# Patient Record
Sex: Male | Born: 1950
Health system: Southern US, Community
[De-identification: ages and names within clinical notes are randomized; demographics above are authoritative.]

## PROBLEM LIST (undated history)

## (undated) DIAGNOSIS — J449 Chronic obstructive pulmonary disease, unspecified: Secondary | ICD-10-CM

## (undated) HISTORY — PX: NECK SURGERY: SHX720

## (undated) HISTORY — DX: Chronic obstructive pulmonary disease, unspecified: J44.9

---

## 2004-11-08 ENCOUNTER — Ambulatory Visit: Payer: Self-pay | Admitting: Unknown Physician Specialty

## 2005-10-02 ENCOUNTER — Ambulatory Visit: Payer: Self-pay | Admitting: Internal Medicine

## 2005-10-22 ENCOUNTER — Encounter: Admission: RE | Admit: 2005-10-22 | Discharge: 2005-10-22 | Payer: Self-pay | Admitting: Neurosurgery

## 2005-10-30 ENCOUNTER — Inpatient Hospital Stay (HOSPITAL_COMMUNITY): Admission: RE | Admit: 2005-10-30 | Discharge: 2005-10-31 | Payer: Self-pay | Admitting: Neurosurgery

## 2008-05-19 ENCOUNTER — Emergency Department: Payer: Self-pay | Admitting: Emergency Medicine

## 2008-10-31 ENCOUNTER — Ambulatory Visit: Payer: Self-pay | Admitting: Family Medicine

## 2008-10-31 DIAGNOSIS — M549 Dorsalgia, unspecified: Secondary | ICD-10-CM

## 2008-10-31 DIAGNOSIS — I10 Essential (primary) hypertension: Secondary | ICD-10-CM

## 2008-10-31 HISTORY — DX: Dorsalgia, unspecified: M54.9

## 2008-10-31 HISTORY — DX: Essential (primary) hypertension: I10

## 2008-11-15 ENCOUNTER — Encounter: Admission: RE | Admit: 2008-11-15 | Discharge: 2008-11-15 | Payer: Self-pay | Admitting: Family Medicine

## 2009-03-15 ENCOUNTER — Ambulatory Visit: Payer: Self-pay | Admitting: Family Medicine

## 2009-03-15 DIAGNOSIS — R5381 Other malaise: Secondary | ICD-10-CM

## 2009-03-15 DIAGNOSIS — R5383 Other fatigue: Secondary | ICD-10-CM

## 2009-03-18 LAB — CONVERTED CEMR LAB
Albumin: 4 g/dL (ref 3.5–5.2)
BUN: 8 mg/dL (ref 6–23)
Calcium: 9.5 mg/dL (ref 8.4–10.5)
Cholesterol: 171 mg/dL (ref 0–200)
Eosinophils Relative: 3.2 % (ref 0.0–5.0)
GFR calc non Af Amer: 81.39 mL/min (ref >60)
Glucose, Bld: 100 mg/dL — ABNORMAL HIGH (ref 70–99)
HCT: 50.3 % (ref 39.0–52.0)
HDL: 54.4 mg/dL (ref >39.00)
Hemoglobin: 16.5 g/dL (ref 13.0–17.0)
Lymphs Abs: 2.6 10*3/uL (ref 0.7–4.0)
Monocytes Relative: 6.2 % (ref 3.0–12.0)
Neutro Abs: 5.1 10*3/uL (ref 1.4–7.7)
PSA: 5.76 ng/mL — ABNORMAL HIGH (ref 0.10–4.00)
Sodium: 141 meq/L (ref 135–145)
VLDL: 18.8 mg/dL (ref 0.0–40.0)
WBC: 8.6 10*3/uL (ref 4.5–10.5)

## 2009-03-19 ENCOUNTER — Ambulatory Visit: Payer: Self-pay | Admitting: Family Medicine

## 2009-03-19 DIAGNOSIS — F17200 Nicotine dependence, unspecified, uncomplicated: Secondary | ICD-10-CM | POA: Insufficient documentation

## 2009-03-19 DIAGNOSIS — R972 Elevated prostate specific antigen [PSA]: Secondary | ICD-10-CM

## 2009-03-19 HISTORY — DX: Elevated prostate specific antigen (PSA): R97.20

## 2009-04-04 ENCOUNTER — Encounter: Payer: Self-pay | Admitting: Family Medicine

## 2009-04-18 ENCOUNTER — Ambulatory Visit: Payer: Self-pay | Admitting: Family Medicine

## 2009-04-24 ENCOUNTER — Encounter: Payer: Self-pay | Admitting: Family Medicine

## 2009-04-25 LAB — CONVERTED CEMR LAB: PSA, Free Pct: 15 — ABNORMAL LOW (ref >25)

## 2010-04-09 NOTE — Letter (Signed)
Summary: [facility]  [facility]   Imported By: Lanelle Bal 04/30/2009 13:52:52  _____________________________________________________________________  External Attachment:    Type:   Image     Comment:   External Document

## 2010-04-09 NOTE — Letter (Signed)
Summary: [facility]  [facility]   Imported By: Sherian Rein 04/16/2009 14:17:02  _____________________________________________________________________  External Attachment:    Type:   Image     Comment:   External Document

## 2010-04-09 NOTE — Assessment & Plan Note (Signed)
Summary: CPX / LFW   Vital Signs:  Patient profile:   60 year old male Height:      76 inches Weight:      191.6 pounds BMI:     23.41 Temp:     98.5 degrees F oral Pulse rate:   92 / minute Pulse rhythm:   regular BP sitting:   160 / 90  (left arm) Cuff size:   regular  Vitals Entered By: Benny Lennert CMA Duncan Dull) (March 19, 2009 2:27 PM)  History of Present Illness: Chief complaint cpx  Colonoscopy - 2007 Tdap - 2004 Flu - declines  Elevated PSA: PSA is 5.7. Reviewed history. No known FH Prostate CA, no tenderness in and around the perineal area, no penile dyscharge, no dysuria, no pain with ejaculation. No increased urination. No pain with BM. No known prior elevated PSA or abnormal rectal exam.  [facility]:     Contraindications/Deferment of Procedures/Staging:    Test/Procedure: FLU VAX    Reason for deferment: patient declined   Preventive Screening-Counseling & Management  Alcohol-Tobacco     Alcohol drinks/day: 0     Alcohol Counseling: not indicated; patient does not drink     Smoking Status: current     Smoke Cessation Stage: ready     Tobacco Counseling: to quit use of tobacco products  Caffeine-Diet-Exercise     Diet Counseling: to improve diet; diet is suboptimal     Does Patient Exercise: no     Exercise Counseling: to improve exercise regimen  Hep-HIV-STD-Contraception     STD Risk: no risk noted     Testicular SE Education/Counseling to perform regular STE      Sexual History:  currently monogamous.        Drug Use:  never.    Allergies (verified): No Known Drug Allergies  Past History:  Past medical, surgical, family and social histories (including risk factors) reviewed, and no changes noted (except as noted below).  Past Medical History: Reviewed history from 10/31/2008 and no changes required. Arthritis chicken pox Hypertension  Past Surgical History: Reviewed history from 10/31/2008 and no changes required. tonsils  and adenoids 1955 7/07 Had neck surgery to replace disc in neck  Family History: Reviewed history from 10/31/2008 and no changes required. Family History of Arthritis Family History Breast cancer 1st degree relative <50 Family History Diabetes 1st degree relative Family History Hypertension Family History of Prostate CA 1st degree relative <50 Family History of Sudden Death  Social History: Reviewed history from 10/31/2008 and no changes required. Occupation:Parts driver Divorced Alcohol use-no Drug use-no Regular exercise-no Quit smokingSmoking Status:  current STD Risk:  no risk noted Sexual History:  currently monogamous Drug Use:  never  Review of Systems  General: Denies fever, chills, sweats, anorexia, fatigue, weakness, malaise Eyes: Denies blurring, vision loss ENT: Denies earache, nasal congestion, nosebleeds, sore throat, and hoarseness.  Cardiovascular: Denies chest pains, palpitations, syncope, dyspnea on exertion,  Respiratory: Denies cough, dyspnea at rest, excessive sputum,wheeezing GI: Denies nausea, vomiting, diarrhea, constipation, change in bowel habits, abdominal pain, melena, hematochezia GU: Denies dysuria, hematuria, discharge, urinary frequency, urinary hesitancy, nocturia, incontinence, genital sores, decreased libido Musculoskeletal: Denies back pain, joint pain Derm: Denies rash, itching Neuro: Denies  paresthesias, frequent falls, frequent headaches, and difficulty walking.  Psych: Denies depression, anxiety Endocrine: Denies cold intolerance, heat intolerance, polydipsia, polyphagia, polyuria, and unusual weight change.  Heme: Denies enlarged lymph nodes Allergy: No hayfever   Otherwise, the pertinent positives and negatives are listed  above and in the HPI, otherwise a full review of systems has been reviewed and is negative unless noted positive.   Physical Exam  General:  Well-developed,well-nourished,in no acute distress; alert,appropriate  and cooperative throughout examination Head:  Normocephalic and atraumatic without obvious abnormalities. No apparent alopecia or balding. Eyes:  vision grossly intact, pupils equal, pupils round, pupils reactive to light, and pupils react to accomodation.   Ears:  External ear exam shows no significant lesions or deformities.  Otoscopic examination reveals clear canals, tympanic membranes are intact bilaterally without bulging, retraction, inflammation or discharge. Hearing is grossly normal bilaterally. Nose:  External nasal examination shows no deformity or inflammation. Nasal mucosa are pink and moist without lesions or exudates. Mouth:  Oral mucosa and oropharynx without lesions or exudates.  Teeth in good repair. Neck:  No deformities, masses, or tenderness noted. Chest Wall:  No deformities, masses, tenderness or gynecomastia noted. Lungs:  Normal respiratory effort, chest expands symmetrically. Lungs are clear to auscultation, no crackles or wheezes. Heart:  Normal rate and regular rhythm. S1 and S2 normal without gallop, murmur, click, rub or other extra sounds. Abdomen:  Bowel sounds positive,abdomen soft and non-tender without masses, organomegaly or hernias noted. Rectal:  No external abnormalities noted. Normal sphincter tone. No rectal masses or tenderness. Genitalia:  Testes bilaterally descended without nodularity, tenderness or masses. No scrotal masses or lesions. No penis lesions or urethral discharge. Prostate:  Enlarged prostate diffusely, no tenderness, not boggy, no nodules Msk:  normal ROM and no crepitation.   Extremities:  No clubbing, cyanosis, edema, or deformity noted with normal full range of motion of all joints.   Neurologic:  alert & oriented X3, sensation intact to light touch, and gait normal.   Skin:  no rashes.   Cervical Nodes:  No lymphadenopathy noted Inguinal Nodes:  No significant adenopathy Psych:  Cognition and judgment appear intact. Alert and  cooperative with normal attention span and concentration. No apparent delusions, illusions, hallucinations   Impression & Recommendations:  Problem # 1:  PREVENTIVE HEALTH CARE (ICD-V70.0) The patient's preventative maintenance and recommended screening tests for an annual wellness exam were reviewed in full today. Brought up to date unless services declined.  Counselled on the importance of diet, exercise, and its role in overall health and mortality. The patient's FH and SH was reviewed, including their home life, tobacco status, and drug and alcohol status.   Lose weight  Problem # 2:  PSA, INCREASED (ICD-790.93) Assessment: New Exam and history not c/w prostatitis. Patient needs further evaluation by Urology.  We reviewed PSA and prostate health for > 10 mins over and above routine maintaince, 100% in counselling. Reviewed false positives, biopsies, and prostate cancer.  Orders: Urology Referral (Urology)  Problem # 3:  SMOKER (ICD-305.1) The patient does smoke cigarettes, and we discussed that tobacco is harmful to one's overall health, and that likely quitting smoking would be the single best thing that they could do for their health.   started smoking again - ready to quit  His updated medication list for this problem includes:    Chantix Starting Month Pak 0.5 Mg X 11 & 1 Mg X 42 Misc (Varenicline tartrate) .Marland Kitchen... 0.5mg  by mouth once daily for 3 days, then twice daily for 4 days and then 1mg  by mouth 2 times daily    Chantix 1 Mg Tabs (Varenicline tartrate) .Marland Kitchen... 1 tab by mouth 2 times daily  Complete Medication List: 1)  Hydrochlorothiazide 25 Mg Tabs (Hydrochlorothiazide) .... Take 1  tab by mouth every morning 2)  Lisinopril-hydrochlorothiazide 20-25 Mg Tabs (Lisinopril-hydrochlorothiazide) .Marland Kitchen.. 1 by mouth daily 3)  Chantix Starting Month Pak 0.5 Mg X 11 & 1 Mg X 42 Misc (Varenicline tartrate) .... 0.5mg  by mouth once daily for 3 days, then twice daily for 4 days and then  1mg  by mouth 2 times daily 4)  Chantix 1 Mg Tabs (Varenicline tartrate) .Marland Kitchen.. 1 tab by mouth 2 times daily  Other Orders: Tobacco use cessation intermediate 3-10 minutes (04540)  Patient Instructions: 1)  Referral Appointment Information 2)  Day/Date: 3)  Time: 4)  Place/MD: 5)  Address: 6)  Phone/Fax: 7)  Patient given appointment information. Information/Orders faxed/mailed.  Prescriptions: CHANTIX 1 MG TABS (VARENICLINE TARTRATE) 1 tab by mouth 2 times daily  #60 x 2   Entered and Authorized by:   Hannah Beat MD   Signed by:   Hannah Beat MD on 03/19/2009   Method used:   Print then Give to Patient   RxID:   [id number] CHANTIX STARTING MONTH PAK 0.5 MG X 11 & 1 MG X 42  MISC (VARENICLINE TARTRATE) 0.5mg  by mouth once daily for 3 days, then twice daily for 4 days and then 1mg  by mouth 2 times daily  #1 pack x 0   Entered and Authorized by:   Hannah Beat MD   Signed by:   Hannah Beat MD on 03/19/2009   Method used:   Electronically to        [facility] [address] (retail)       [address]       [place], [place]  [address]       Ph: [phone]       Fax: [phone]   RxID:   [id number] LISINOPRIL-HYDROCHLOROTHIAZIDE 20-25 MG TABS (LISINOPRIL-HYDROCHLOROTHIAZIDE) 1 by mouth daily  #30 x 5   Entered and Authorized by:   Hannah Beat MD   Signed by:   Hannah Beat MD on 03/19/2009   Method used:   Electronically to        [facility] [address] (retail)       [address]       [place], [place]  [address]       Ph: [phone]       Fax: [phone]   RxID:   [id number]   Current Allergies (reviewed today): No known allergies   TD Result Date:  03/10/2002 TD Result:  given Colonoscopy Result Date:  03/10/2005 Colonoscopy Result:  abnormal Colonoscopy Next Due:  5 yr

## 2010-07-26 NOTE — Op Note (Signed)
NAME:  EDER, MACEK NO.:  [id number]   MEDICAL RECORD NO.:  [id number]          PATIENT TYPE:  INP   LOCATION:  3012                         FACILITY:  [facility]   PHYSICIAN:  Clydene Fake, M.D.  DATE OF BIRTH:  12/28/1950   DATE OF PROCEDURE:  10/30/2005  DATE OF DISCHARGE:                                 OPERATIVE REPORT   DIAGNOSES:  Atrophy, spondylosis, femoral stenosis.  Severe weakness right  arm C3-4, C4-5, and C5-6.   POSTOPERATIVE DIAGNOSES:  Atrophy spondylosis, femoral stenosis.  Severe  weakness right arm C3-4, C4-5, and C5-6.   PROCEDURE:  Intracervical decompression, diskectomy fusion at C3-4, C4-5,  and C5-6 with left allograft bone and Eagle anterior cervical plate from C3-  6.   SURGEON:  Phoebe Perch.   ASSISTANT:  Venetia Maxon.   ANESTHESIA:  General endotracheal tube.   ESTIMATED BLOOD LOSS:  Minimal.   BLOOD GIVEN:  None.   DRAINS:  None.   COMPLICATIONS:  None.   REASON FOR PROCEDURE:  The patient is a 60 year old gentleman who started  having some right-sided neck and shoulder pain in the beginning of July and  over a couple-week period of time with progressive weakness to his right  shoulder girdle.  He was weak with an inability to abduct his arm more than  about 10 degrees.  MRI and then a myelogram, and EMG nerve conduction tests  were all done showing spondylitic changes of his cervical spine with left  biforaminal narrowing C3-4, __________ left foraminal narrowing at C4-5, and  biforaminal narrowing with spondylitic changes at C5-6 with a suggestion of  a disk herniation off to the right side.  Imaging showed an acute severe  right C5 radiculopathy.  After much discussion, it was decided to decompress  the nerve roots coming off the right side to see if we can help the  patient's severe weakness.   PROCEDURE IN DETAIL:  The patient was brought to the operating room.  General anesthesia was inducted.  The patient was placed in  10-pound  cervical traction, prepped and draped in a sterile fashion.  The proposed  incision was injected with 10 mL of 1% lidocaine with epinephrine.  Incision  was then made from the midline to the anterior border of the  sternocleidomastoid muscle on the left side.  Neck incision taken down to  the platysma and hemostasis was obtained with the Bovie.  The platysma was  opened with the Bovie and then blunt dissection was taken through the  anterior cervical fascia to the anterior cervical spine.  A needle was  placed in the interspace.  An x-ray was obtained showing the C4-5  interspace.  The interspace was incised with a 15 blade.  Needle was  removed.  Partial diskectomy performed.  Longus colli muscles were reflected  laterally from C3 through C6 bilaterally.  Self-retaining retractors were  placed between the C3-4 and C4-5 disk.  Distraction pins were placed in the  C3 and C5.  The interspaces were incised with a 15 blade.  Partial  diskectomy performed and the  interspace distracted.  Microscope was brought  in for microdissection and starting at C4-5 the diskectomy was continued  with pituitary rongeurs and curettes.  When we got to the posterior disk  space, 1 and 2 mm Kerrison punches were used to remove the posterior disk,  posterior ligament and posterior osteophytes and then performed a bilateral  foraminotomy.  When we were finished we obtained good central decompression  and bilateral foraminotomies.  We did go far lateral to the right side,  uncovering the C5 root.  No disk herniation was seen though there were spurs  and significant stenosis of the canal and foramen.  Again, when we were  finished, we had good decompression of the C5 roots bilaterally.  Gelfoam  and thrombin was used for good hemostasis.  Our attention was taken to C3-4.  Again, diskectomy was continued with pituitary rongeurs and curettes, 1 and  2 mm Kerrison punches were used to remove posterior disk,  posterior  ligation, posterior osteophytes, and then performed bilateral  foraminotomies.  We did find osteophytes into the canal and into the foramen  on the right, so these were removed with good decompression of the C3 roots.  We used a high-speed drill to remove cartilaginous end plate at both levels.  Measured the interspaces to be 6 mm at C4-5 and 5 mm at C3-4 with good  hemostasis with Gelfoam and thrombin.  This was then irrigated out and then  we tapped a 6-mm bone into the C4-5 interspace countersinking it a couple  millimeters and checking posterior to the graft and there was plenty of room  between the bone graft and dura.  This process was repeated at C3-4 with a 5-  mm graft.  Distraction was removed, the distraction pin removed from the C3  and placed in the C6.  The retractors were moved down so we could see the C5-  6 interspace and it was incised with a 15 blade.  Diskectomy started with  pituitary rongeurs and the interspace was distracted.  Diskectomy was then  continued with pituitary rongeurs and curettes and high-speed drill to  removed cartilaginous endplate.  Then 1 and 2 mm Kerrison punches were used  to remove posterior disk and ligaments and posterior osteophytes and then  start bilateral foraminotomies.  On the right side, an extremely large  fragment of disk was found laterally and we pulled this out of the canal and  decompressed the root when we were finished.  Good decompression of both C6  roots.  Hemostasis with Gelfoam and thrombin.  We measured the height of the  disk space to be 6 mm.  Gelfoam and thrombin was irrigated out and a 6-mm  __________ was tapped into place and countersunk a couple of millimeters.  Again, we checked posterior to the graft.  There is plenty of room between  bone graft and dura and we used a nerve hook for this.  Disk traction pins removed.  Hemostasis in the bone obtained with Gelfoam and thrombin.  An  Eagle anterior cervical  plate was placed over the anterior cervical spine  and two screws were placed into the C3, two in the C4, two in the C5, and  two in the C6.  These were all tightened down and a lateral x-ray was  obtained showing good position of plate, screws, and bone plugs from C3-6.  Retractor was removed.  Good hemostasis with bipolar cauterization and  Gelfoam and thrombin.  We irrigated with antibiotic  solution. When we had  good hemostasis, the platysma was closed with 3-0 Vicryl interrupted suture.  The subcutaneous tissue closed with the same.  Skin closed with Benzoin and  Steri-Strips.  Dressing was placed.  The patient was placed in a soft  cervical collar, woken from anesthesia, and transferred to recovery room in  stable condition.           ______________________________  Clydene Fake, M.D.     JRH/MEDQ  D:  10/30/2005  T:  10/31/2005  Job:  [id number]

## 2012-01-12 ENCOUNTER — Emergency Department: Payer: Self-pay | Admitting: Emergency Medicine

## 2012-01-12 LAB — URINALYSIS, COMPLETE
Bacteria: NONE SEEN
Bilirubin,UR: NEGATIVE
Blood: NEGATIVE
Glucose,UR: NEGATIVE mg/dL (ref 0–75)
Ketone: NEGATIVE
Nitrite: NEGATIVE
Specific Gravity: 1.015 (ref 1.003–1.030)
WBC UR: 1 /HPF (ref 0–5)

## 2012-01-12 LAB — CBC
HCT: 47.8 % (ref 40.0–52.0)
HGB: 16.7 g/dL (ref 13.0–18.0)
RDW: 12.8 % (ref 11.5–14.5)
WBC: 10 10*3/uL (ref 3.8–10.6)

## 2012-01-12 LAB — COMPREHENSIVE METABOLIC PANEL
BUN: 8 mg/dL (ref 7–18)
Bilirubin,Total: 0.6 mg/dL (ref 0.2–1.0)
Chloride: 109 mmol/L — ABNORMAL HIGH (ref 98–107)
Creatinine: 0.99 mg/dL (ref 0.60–1.30)
EGFR (African American): >60
Potassium: 4.3 mmol/L (ref 3.5–5.1)
SGPT (ALT): 44 U/L (ref 12–78)
Total Protein: 7.8 g/dL (ref 6.4–8.2)

## 2012-01-12 LAB — TROPONIN I: Troponin-I: <0.02 ng/mL

## 2012-01-12 LAB — CK TOTAL AND CKMB (NOT AT ARMC): CK, Total: 110 U/L (ref 35–232)

## 2017-03-27 ENCOUNTER — Ambulatory Visit: Payer: Self-pay | Admitting: Podiatry

## 2017-03-27 ENCOUNTER — Encounter: Payer: Self-pay | Admitting: Podiatry

## 2017-03-27 ENCOUNTER — Ambulatory Visit (INDEPENDENT_AMBULATORY_CARE_PROVIDER_SITE_OTHER): Payer: Medicare Other | Admitting: Podiatry

## 2017-03-27 DIAGNOSIS — L603 Nail dystrophy: Secondary | ICD-10-CM | POA: Diagnosis not present

## 2017-03-27 NOTE — Progress Notes (Signed)
   Subjective:    Patient ID: Jimmy Wilson, male    DOB: 1950/06/15, 67 y.o.   MRN: [id number]  HPI    Review of Systems     Objective:   Physical Exam        Assessment & Plan:

## 2017-03-30 NOTE — Progress Notes (Signed)
   SUBJECTIVE Patient presents to office today complaining possible nail fungus to the left great toe that began about one year ago. He reports associated pain of the nail. He has not taken anything for the symptoms but keeps it filed down. There are no modifying factors noted. Patient is here for further evaluation and treatment.   No past medical history on file.  OBJECTIVE General Patient is awake, alert, and oriented x 3 and in no acute distress. Derm Hyperkeratotic, discolored, thickened, onychodystrophy of left great toenail. Skin is dry and supple bilateral. Negative open lesions or macerations. Remaining integument unremarkable. No signs of infection noted. Vasc  DP and PT pedal pulses palpable bilaterally. Temperature gradient within normal limits.  Neuro Epicritic and protective threshold sensation diminished bilaterally.  Musculoskeletal Exam No symptomatic pedal deformities noted bilateral. Muscular strength within normal limits.  ASSESSMENT 1. Dystrophic left great toenail  PLAN OF CARE 1. Patient evaluated today.  2. Instructed to maintain good pedal hygiene and foot care.  3. Mechanical debridement of left great toenail performed using a nail nipper. Filed with dremel without incident.  4. Return to clinic as needed.    Edrick Kins, DPM [facility]  Dr. Edrick Kins, [address]                                        [place], [place] [address]                Office [phone]  Fax [phone]

## 2018-06-23 ENCOUNTER — Emergency Department
Admission: EM | Admit: 2018-06-23 | Discharge: 2018-06-23 | Disposition: A | Discharge status: Home / Self Care | Payer: Medicare Other | Attending: Emergency Medicine | Admitting: Emergency Medicine

## 2018-06-23 ENCOUNTER — Other Ambulatory Visit: Payer: Self-pay

## 2018-06-23 ENCOUNTER — Encounter: Payer: Self-pay | Admitting: Emergency Medicine

## 2018-06-23 ENCOUNTER — Emergency Department: Payer: Medicare Other

## 2018-06-23 DIAGNOSIS — R1032 Left lower quadrant pain: Secondary | ICD-10-CM | POA: Insufficient documentation

## 2018-06-23 DIAGNOSIS — I10 Essential (primary) hypertension: Secondary | ICD-10-CM | POA: Insufficient documentation

## 2018-06-23 DIAGNOSIS — R109 Unspecified abdominal pain: Secondary | ICD-10-CM

## 2018-06-23 DIAGNOSIS — F17210 Nicotine dependence, cigarettes, uncomplicated: Secondary | ICD-10-CM | POA: Insufficient documentation

## 2018-06-23 LAB — URINALYSIS, COMPLETE (UACMP) WITH MICROSCOPIC
Bacteria, UA: NONE SEEN
Bilirubin Urine: NEGATIVE
Glucose, UA: NEGATIVE mg/dL
Hgb urine dipstick: NEGATIVE
Ketones, ur: NEGATIVE mg/dL
Leukocytes,Ua: NEGATIVE
Nitrite: NEGATIVE
Protein, ur: NEGATIVE mg/dL
Specific Gravity, Urine: 1.017 (ref 1.005–1.030)
pH: 5 (ref 5.0–8.0)

## 2018-06-23 LAB — CBC WITH DIFFERENTIAL/PLATELET
Abs Immature Granulocytes: 0.03 10*3/uL (ref 0.00–0.07)
Basophils Absolute: 0 10*3/uL (ref 0.0–0.1)
Basophils Relative: 0 %
Eosinophils Absolute: 0.1 10*3/uL (ref 0.0–0.5)
Eosinophils Relative: 1 %
HCT: 49.6 % (ref 39.0–52.0)
Hemoglobin: 17.5 g/dL — ABNORMAL HIGH (ref 13.0–17.0)
Immature Granulocytes: 0 %
Lymphocytes Relative: 37 %
Lymphs Abs: 2.5 10*3/uL (ref 0.7–4.0)
MCH: 34.1 pg — ABNORMAL HIGH (ref 26.0–34.0)
MCHC: 35.3 g/dL (ref 30.0–36.0)
MCV: 96.7 fL (ref 80.0–100.0)
Monocytes Absolute: 0.5 10*3/uL (ref 0.1–1.0)
Monocytes Relative: 7 %
Neutro Abs: 3.7 10*3/uL (ref 1.7–7.7)
Neutrophils Relative %: 55 %
Platelets: 174 10*3/uL (ref 150–400)
RBC: 5.13 MIL/uL (ref 4.22–5.81)
RDW: 12.5 % (ref 11.5–15.5)
WBC: 6.8 10*3/uL (ref 4.0–10.5)
nRBC: 0 % (ref 0.0–0.2)

## 2018-06-23 LAB — COMPREHENSIVE METABOLIC PANEL
ALT: 141 U/L — ABNORMAL HIGH (ref 0–44)
AST: 119 U/L — ABNORMAL HIGH (ref 15–41)
Albumin: 4.1 g/dL (ref 3.5–5.0)
Alkaline Phosphatase: 94 U/L (ref 38–126)
Anion gap: 7 (ref 5–15)
BUN: 12 mg/dL (ref 8–23)
CO2: 26 mmol/L (ref 22–32)
Calcium: 9.1 mg/dL (ref 8.9–10.3)
Chloride: 106 mmol/L (ref 98–111)
Creatinine, Ser: 0.95 mg/dL (ref 0.61–1.24)
GFR calc Af Amer: >60 mL/min (ref >60)
GFR calc non Af Amer: >60 mL/min (ref >60)
Glucose, Bld: 103 mg/dL — ABNORMAL HIGH (ref 70–99)
Potassium: 4.4 mmol/L (ref 3.5–5.1)
Sodium: 139 mmol/L (ref 135–145)
Total Bilirubin: 1.1 mg/dL (ref 0.3–1.2)
Total Protein: 8.4 g/dL — ABNORMAL HIGH (ref 6.5–8.1)

## 2018-06-23 LAB — LIPASE, BLOOD: Lipase: 36 U/L (ref 11–51)

## 2018-06-23 MED ORDER — KETOROLAC TROMETHAMINE 30 MG/ML IJ SOLN
15.0000 mg | Freq: Once | INTRAMUSCULAR | Status: AC
  Administered 2018-06-23: 12:00:00 15 mg via INTRAVENOUS
  Filled 2018-06-23: qty 1

## 2018-06-23 MED ORDER — PREDNISONE 10 MG (21) PO TBPK: ORAL_TABLET | ORAL | 0 refills | Status: DC

## 2018-06-23 MED ORDER — MORPHINE SULFATE (PF) 4 MG/ML IV SOLN
4.0000 mg | Freq: Once | INTRAVENOUS | Status: AC
Start: 2018-06-23 — End: 2018-06-23
  Administered 2018-06-23: 10:00:00 4 mg via INTRAVENOUS
  Filled 2018-06-23: qty 1

## 2018-06-23 MED ORDER — TRAMADOL HCL 50 MG PO TABS: 50.0000 mg | ORAL_TABLET | Freq: Four times a day (QID) | ORAL | 0 refills | Status: DC | PRN

## 2018-06-23 MED ORDER — DEXAMETHASONE SODIUM PHOSPHATE 10 MG/ML IJ SOLN
10.0000 mg | Freq: Once | INTRAMUSCULAR | Status: AC
  Administered 2018-06-23: 12:00:00 10 mg via INTRAVENOUS
  Filled 2018-06-23: qty 1

## 2018-06-23 NOTE — ED Notes (Signed)
First Nurse Note: Patient complaining of left flank pain since yesterday, getting worse.  Hx of renal calculi.

## 2018-06-23 NOTE — ED Notes (Signed)
Patient transported to CT 

## 2018-06-23 NOTE — ED Triage Notes (Signed)
Pt in via POV, reports left flank pain x 2 days, denies urinary symptoms, denies N/V.  Ambulatory to triage, NAD noted at this time.

## 2018-06-23 NOTE — ED Provider Notes (Signed)
[facility] Emergency Department Provider Note       Time seen: ----------------------------------------- 9:27 AM on 06/23/2018 -----------------------------------------   I have reviewed the triage vital signs and the nursing notes.  HISTORY   Chief Complaint Flank Pain   HPI Jimmy Wilson is a 68 y.o. male with a history of hypertension who presents to the ED for left flank pain for the past 2 days.  Patient denies any urinary symptoms, denies nausea or vomiting.  Patient reports he is never had a kidney stone that he knows.  He has had flank pain in the past which resolved on its own.  History reviewed. No pertinent past medical history.  Patient Active Problem List   Diagnosis Date Noted  . SMOKER 03/19/2009  . PSA, INCREASED 03/19/2009  . OTHER MALAISE AND FATIGUE 03/15/2009  . HYPERTENSION 10/31/2008  . BACK PAIN 10/31/2008    History reviewed. No pertinent surgical history.  Allergies Aspirin  Social History Social History   Tobacco Use  . Smoking status: Current Every Day Smoker    Packs/day: 1.50    Types: Cigarettes  . Smokeless tobacco: Never Used  Substance Use Topics  . Alcohol use: Yes    Alcohol/week: 2.0 - 3.0 standard drinks    Types: 2 - 3 Cans of beer per week  . Drug use: No   Review of Systems Constitutional: Negative for fever. Cardiovascular: Negative for chest pain. Respiratory: Negative for shortness of breath. Gastrointestinal: Positive for left flank pain Musculoskeletal: Positive for left-sided mid back pain Skin: Negative for rash. Neurological: Negative for headaches, focal weakness or numbness.  All systems negative/normal/unremarkable except as stated in the HPI  ____________________________________________   PHYSICAL EXAM:  VITAL SIGNS: ED Triage Vitals  Enc Vitals Group     BP 06/23/18 0924 (!) 187/97     Pulse Rate 06/23/18 0924 63     Resp 06/23/18 0924 16     Temp 06/23/18 0924 (!)  97.5 F (36.4 C)     Temp Source 06/23/18 0924 Oral     SpO2 06/23/18 0924 99 %     Weight 06/23/18 0925 185 lb (83.9 kg)     Height 06/23/18 0925 6\' 3"  (1.905 m)     Head Circumference --      Peak Flow --      Pain Score 06/23/18 0924 9     Pain Loc --      Pain Edu? --      Excl. in [place]? --    Constitutional: Alert and oriented.  Mild distress from pain ENT      Head: Normocephalic and atraumatic.      Nose: No congestion/rhinnorhea.      Mouth/Throat: Mucous membranes are moist.      Neck: No stridor. Cardiovascular: Normal rate, regular rhythm. No murmurs, rubs, or gallops. Respiratory: Normal respiratory effort without tachypnea nor retractions. Breath sounds are clear and equal bilaterally. No wheezes/rales/rhonchi. Gastrointestinal: Left flank and CVA tenderness, no rebound or guarding.  Normal bowel sounds. Musculoskeletal: Nontender with normal range of motion in extremities. No lower extremity tenderness nor edema. Neurologic:  Normal speech and language. No gross focal neurologic deficits are appreciated.  Skin:  Skin is warm, dry and intact. No rash noted. Psychiatric: Mood and affect are normal. Speech and behavior are normal.   ____________________________________________  ED COURSE:  As part of my medical decision making, I reviewed the following data within the [place] History obtained from  family if available, nursing notes, old chart and ekg, as well as notes from prior ED visits. Patient presented for flank pain, we will assess with labs and imaging as indicated at this time.   Procedures  Jimmy Wilson was evaluated in Emergency Department on 06/23/2018 for the symptoms described in the history of present illness. He was evaluated in the context of the global COVID-19 pandemic, which necessitated consideration that the patient might be at risk for infection with the SARS-CoV-2 virus that causes COVID-19. Institutional protocols and algorithms  that pertain to the evaluation of patients at risk for COVID-19 are in a state of rapid change based on information released by regulatory bodies including the CDC and federal and state organizations. These policies and algorithms were followed during the patient's care in the ED.  ____________________________________________   LABS (pertinent positives/negatives)  Labs Reviewed  CBC WITH DIFFERENTIAL/PLATELET - Abnormal; Notable for the following components:      Result Value   Hemoglobin 17.5 (*)    MCH 34.1 (*)    All other components within normal limits  COMPREHENSIVE METABOLIC PANEL - Abnormal; Notable for the following components:   Glucose, Bld 103 (*)    Total Protein 8.4 (*)    AST 119 (*)    ALT 141 (*)    All other components within normal limits  URINALYSIS, COMPLETE (UACMP) WITH MICROSCOPIC - Abnormal; Notable for the following components:   Color, Urine YELLOW (*)    APPearance CLEAR (*)    All other components within normal limits  LIPASE, BLOOD    RADIOLOGY Images were viewed by me  CT renal protocol IMPRESSION: 1. No evidence of urinary tract calculus or hydronephrosis. No clear explanation for left flank pain. 2. Findings consistent with hepatic cirrhosis. Prominent lymph nodes in porta hepatis are likely reactive, although not entirely specific. Further clinical evaluation or imaging follow-up may be warranted. 3. Indeterminate soft tissue nodularity posterior to the liver. 4. Incidental findings including moderate Aortic Atherosclerosis (ICD10-I70.0), prostatomegaly, lumbar spondylosis, left renal cysts and possible bladder wall thickening. ____________________________________________   DIFFERENTIAL DIAGNOSIS   Renal colic, UTI, pyelonephritis, muscle strain, radicular back pain  FINAL ASSESSMENT AND PLAN  Flank pain, likely cirrhosis   Plan: The patient had presented for left flank pain. Patient's labs did reveal some elevations in his liver  function tests. Patient's imaging was negative for kidney stone.  He does have findings consistent with cirrhosis of the liver.  His back pain is likely musculoskeletal in origin.  I will prescribe NSAIDs and muscle relaxants.  He is cleared for outpatient follow-up.   Laurence Aly, MD    Note: This note was generated in part or whole with voice recognition software. Voice recognition is usually quite accurate but there are transcription errors that can and very often do occur. I apologize for any typographical errors that were not detected and corrected.     Earleen Newport, MD 06/23/18 1120

## 2018-10-27 ENCOUNTER — Ambulatory Visit
Admission: RE | Admit: 2018-10-27 | Discharge: 2018-10-27 | Disposition: A | Discharge status: Home / Self Care | Payer: Medicare Other | Source: Ambulatory Visit | Attending: Internal Medicine | Admitting: Internal Medicine

## 2018-10-27 ENCOUNTER — Other Ambulatory Visit: Payer: Self-pay | Admitting: Internal Medicine

## 2018-10-27 DIAGNOSIS — M545 Low back pain, unspecified: Secondary | ICD-10-CM

## 2018-10-27 DIAGNOSIS — J449 Chronic obstructive pulmonary disease, unspecified: Secondary | ICD-10-CM

## 2018-10-27 DIAGNOSIS — M546 Pain in thoracic spine: Secondary | ICD-10-CM

## 2018-10-27 IMAGING — CR CHEST - 2 VIEW
2 series · 2 of 2 positions shown · non-contrast
Comparison: [DATE]

CLINICAL DATA: COPD

EXAM:
CHEST - 2 VIEW

[w chest pa]
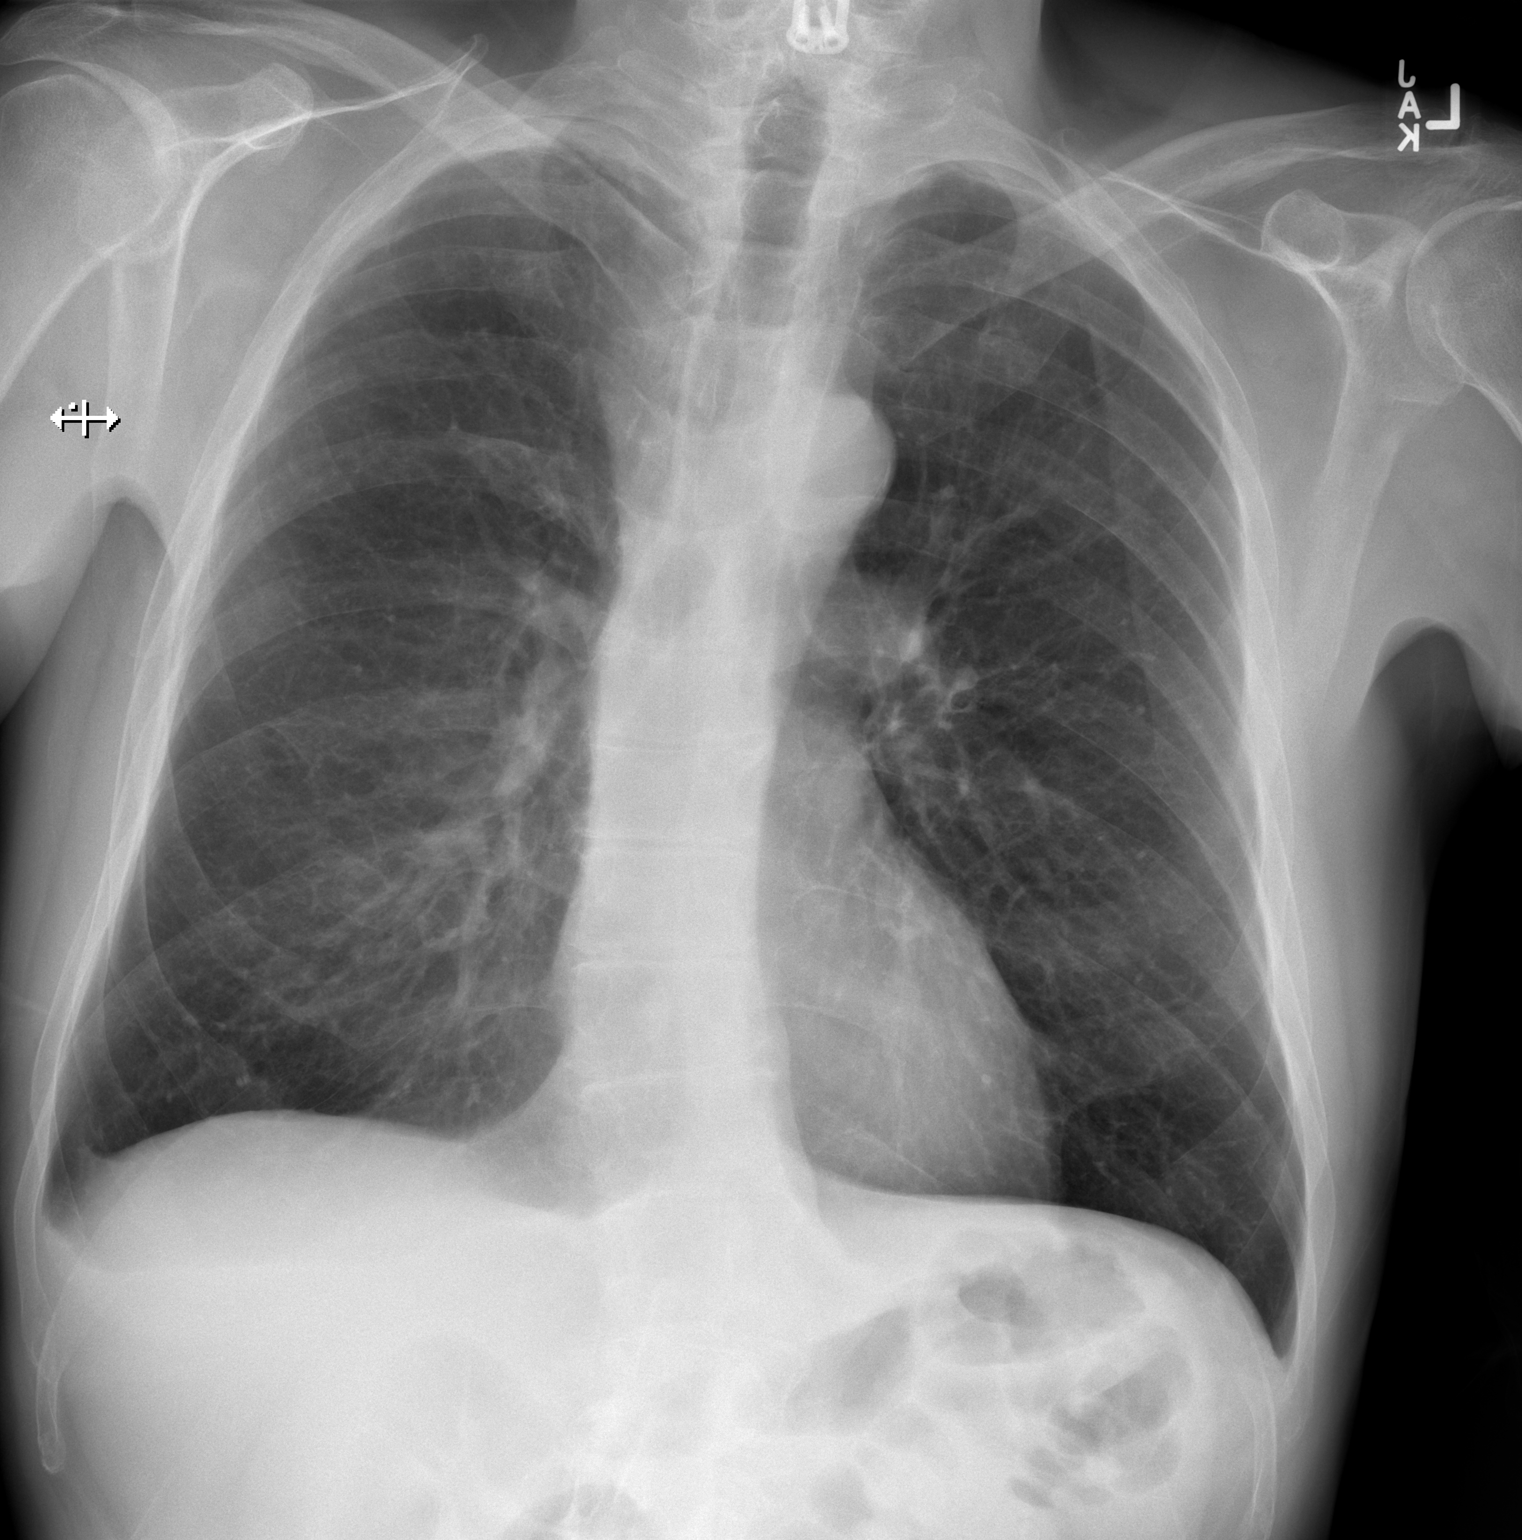

[w chest lat]
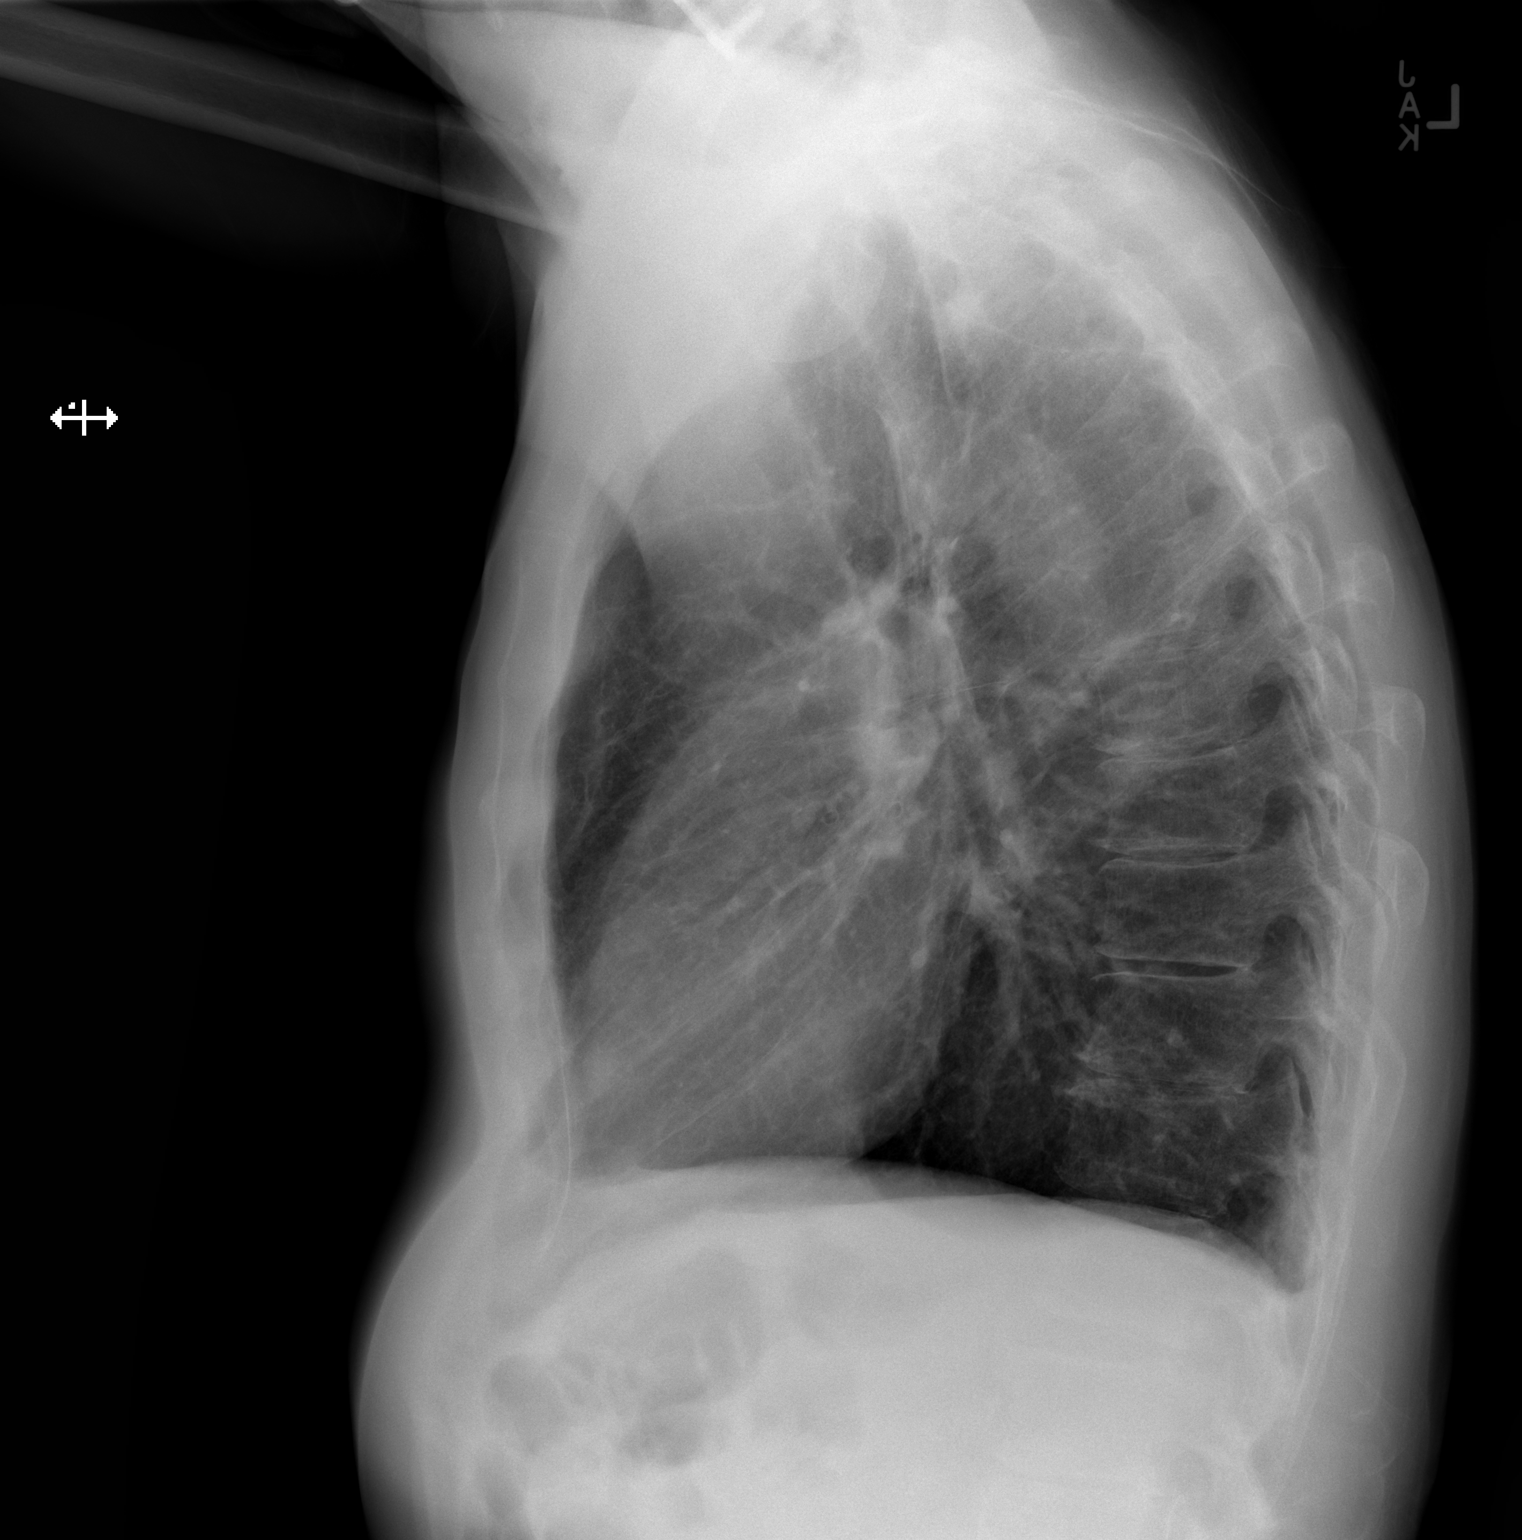

[2 of 2 positions shown; findings below may reference images not displayed]

FINDINGS: There is hyperinflation of the lungs compatible with COPD. Blunting
of the right costophrenic angle could reflect small right pleural
effusion. Lungs clear. Heart is normal size. No acute bony
abnormality.
IMPRESSION: COPD.  Possible small right effusion.

## 2019-02-10 ENCOUNTER — Other Ambulatory Visit: Payer: Self-pay | Admitting: Internal Medicine

## 2019-02-10 DIAGNOSIS — M5416 Radiculopathy, lumbar region: Secondary | ICD-10-CM

## 2019-02-17 ENCOUNTER — Ambulatory Visit
Admission: RE | Admit: 2019-02-17 | Discharge: 2019-02-17 | Disposition: A | Discharge status: Home / Self Care | Payer: Medicare Other | Source: Ambulatory Visit | Attending: Internal Medicine | Admitting: Internal Medicine

## 2019-02-17 ENCOUNTER — Other Ambulatory Visit: Payer: Self-pay

## 2019-02-17 DIAGNOSIS — M5416 Radiculopathy, lumbar region: Secondary | ICD-10-CM

## 2019-02-21 ENCOUNTER — Other Ambulatory Visit: Payer: Self-pay

## 2019-02-21 ENCOUNTER — Ambulatory Visit (INDEPENDENT_AMBULATORY_CARE_PROVIDER_SITE_OTHER): Payer: Medicare Other | Admitting: Internal Medicine

## 2019-02-21 ENCOUNTER — Encounter: Payer: Self-pay | Admitting: Internal Medicine

## 2019-02-21 ENCOUNTER — Telehealth: Payer: Self-pay | Admitting: Pharmacy Technician

## 2019-02-21 DIAGNOSIS — K7460 Unspecified cirrhosis of liver: Secondary | ICD-10-CM

## 2019-02-21 DIAGNOSIS — B182 Chronic viral hepatitis C: Secondary | ICD-10-CM | POA: Insufficient documentation

## 2019-02-21 HISTORY — DX: Unspecified cirrhosis of liver: K74.60

## 2019-02-21 HISTORY — DX: Chronic viral hepatitis C: B18.2

## 2019-02-21 NOTE — Telephone Encounter (Addendum)
[facility] Patient Advocate Encounter    Findings of the benefits investigation:   Insurance: [employer] PartD  Prior Authorization: will begin insurance process once medication is prescribed. He is aware of the process and would like the medication mailed once it's approved. Home address and phone number are confirmed.  [place], [place] [address] [place], [place] Patient [facility] for Infectious Disease Phone: [phone] Fax: [phone] 02/21/2019 9:04 AM

## 2019-02-21 NOTE — Patient Instructions (Signed)
Date 02/21/19  Dear Jimmy Wilson, As discussed in the [facility], your hepatitis C therapy will include highly effective medication(s) for treatment and will vary based on the type of hepatitis C and insurance approval.  Potential medications include:          Harvoni (sofosbuvir 90mg /ledipasvir 400mg ) tablet oral daily          OR     Epclusa (sofosbuvir 400mg /velpatasvir 100mg ) tablet oral daily          OR      Mavyret (glecaprevir 100 mg/pibrentasvir 40 mg): Take 3 tablets oral daily              Medications are typically for 12 weeks total ---------------------------------------------------------------- Your HCV Treatment Start Date: You will be notified by our office once the medication is approved and where you can pick it up (or if mailed)   ---------------------------------------------------------------- [place]:   [facility] [place], [place] [address] Phone: [phone] Hours: Monday to Friday 7:30 am to 6:00 pm   Please always contact your pharmacy at least 3-4 business days before you run out of medications to ensure your next month's medication is ready or 1 week prior to running out if you receive it by mail.  Remember, each prescription is for 28 days. ---------------------------------------------------------------- GENERAL NOTES REGARDING YOUR HEPATITIS C MEDICATION:  Some medications have the following interactions:  - Acid reducing agents such as H2 blockers (ie. Pepcid (famotidine), Zantac (ranitidine), Tagamet (cimetidine), Axid (nizatidine) and proton pump inhibitors (ie. Prilosec (omeprazole), Protonix (pantoprazole), Nexium (esomeprazole), or Aciphex (rabeprazole)). Do not take until you have discussed with a health care provider.    -Antacids that contain magnesium and/or aluminum hydroxide (ie. Milk of Magensia, Rolaids, Gaviscon, Maalox, Mylanta, an dArthritis Pain Formula).  -Calcium carbonate (calcium  supplements or antacids such as Tums, Caltrate, Os-Cal).  -St. John's wort or any products that contain St. John's wort like some herbal supplements  Please inform the office prior to starting any of these medications.  - The common side effects associated with Harvoni include:      1. Fatigue      2. Headache      3. Nausea      4. Diarrhea      5. Insomnia  Please note that this only lists the most common side effects and is NOT a comprehensive list of the potential side effects of these medications. For more information, please review the drug information sheets that come with your medication package from the pharmacy.  ---------------------------------------------------------------- GENERAL HELPFUL HINTS ON HCV THERAPY: 1. Stay well-hydrated. 2. Notify the [facility] Clinic of any changes in your other over-the-counter/herbal or prescription medications. 3. If you miss a dose of your medication, take the missed dose as soon as you remember. Return to your regular time/dose schedule the next day.  4.  Do not stop taking your medications without first talking with your healthcare provider. 5.  You may take Tylenol (acetaminophen), as long as the dose is less than 2000 mg (OR no more than 4 tablets of the Tylenol Extra Strengths 500mg  tablet) in 24 hours. 6.  You will see our pharmacist-specialist within the first 2 weeks of starting your medication to monitor for any possible side effects. 7.  You will have labs once during treatment, soon after treatment completion and one final lab 6 months after treatment completion to verify the virus is out of your system.  Thayer Headings, MD  [facility] [place] [address] [place], [place]  [id number] [phone]

## 2019-02-21 NOTE — Progress Notes (Signed)
[place] for Infectious Disease   CC: consideration for treatment for chronic hepatitis C  HPI:  +Jimmy Wilson is a 68 y.o. male who presents for initial evaluation and management of chronic hepatitis C.  Patient tested positive earlier this year during evaluation by dermatology. Hepatitis C-associated risk factors present are: none. Patient denies IV drug abuse. Patient has not had other studies performed. Results: no results available to me. Patient has not had prior treatment for Hepatitis C. Patient does have a past history of liver disease. Patient does not have a family history of liver disease. Patient does not  have associated signs or symptoms related to liver disease.  Labs reviewed and confirm chronic hepatitis C with a positive viral load, by report Records reviewed from PCP and has htn,chronic back issues.   He has known cirrhosis on CT scan in April 2020.    Patient does not have documented immunity to Hepatitis A. Patient does not have documented immunity to Hepatitis B.    Review of Systems:  Constitutional: negative for fatigue, malaise and anorexia Gastrointestinal: negative for nausea and diarrhea Integument/breast: positive for rash Musculoskeletal: negative for myalgias and arthralgias All other systems reviewed and are negative     PMH: DDD, HTN  Prior to Admission medications   Medication Sig Start Date End Date Taking? Authorizing Provider  lisinopril-hydrochlorothiazide (ZESTORETIC) 10-12.5 MG tablet Take 1 tablet by mouth daily. 02/01/19  Yes [provider]  predniSONE (STERAPRED UNI-PAK 21 TAB) 10 MG (21) TBPK tablet Dispense taper pack as directed Patient not taking: Reported on 02/21/2019 06/23/18   Earleen Newport, MD  traMADol (ULTRAM) 50 MG tablet Take 1 tablet (50 mg total) by mouth every 6 (six) hours as needed. Patient not taking: Reported on 02/21/2019 06/23/18 06/23/19  Earleen Newport, MD    Allergies  Allergen  Reactions  . Aspirin Hypertension    Social History   Tobacco Use  . Smoking status: Current Every Day Smoker    Packs/day: 1.50    Types: Cigarettes  . Smokeless tobacco: Never Used  Substance Use Topics  . Alcohol use: Yes    Alcohol/week: 2.0 - 3.0 standard drinks    Types: 2 - 3 Cans of beer per week    Comment: 3x per week  . Drug use: No   Wann: no liver disease   Objective:  Constitutional: in no apparent distress and alert,  Vitals:   02/21/19 0842  BP: (!) 157/92  Pulse: 81   Eyes: anicteric Cardiovascular: Cor RRR Respiratory: CTA B; normal respiratory effort Gastrointestinal: Bowel sounds are normal, liver is not enlarged, spleen is not enlarged Musculoskeletal: no pedal edema noted Skin: hands with small rash, dry, flaky; no porphyria cutanea tarda Lymphatic: no cervical lymphadenopathy   Laboratory Genotype: No results found for: HCVGENOTYPE HCV viral load: No results found for: HCVQUANT Lab Results  Component Value Date   WBC 6.8 06/23/2018   HGB 17.5 (H) 06/23/2018   HCT 49.6 06/23/2018   MCV 96.7 06/23/2018   PLT 174 06/23/2018    Lab Results  Component Value Date   CREATININE 0.95 06/23/2018   BUN 12 06/23/2018   NA 139 06/23/2018   K 4.4 06/23/2018   CL 106 06/23/2018   CO2 26 06/23/2018    Lab Results  Component Value Date   ALT 141 (H) 06/23/2018   AST 119 (H) 06/23/2018   ALKPHOS 94 06/23/2018     Labs and history reviewed and show  CHILD-PUGH unknown  5-6 points: Child class A 7-9 points: Child class B 10-15 points: Child class C  Lab Results  Component Value Date   BILITOT 1.1 06/23/2018   ALBUMIN 4.1 06/23/2018     Assessment: New Patient with Chronic Hepatitis C genotype unknown, untreated.  I discussed with the patient the lab findings that confirm chronic hepatitis C as well as the natural history and progression of disease including about 30% of people who develop cirrhosis of the liver if left untreated and once  cirrhosis is established there is a 2-7% risk per year of liver cancer and liver failure.  I discussed the importance of treatment and benefits in reducing the risk, even if significant liver fibrosis exists.   Plan: 1) Patient counseled extensively on limiting acetaminophen to no more than 2 grams daily, avoidance of alcohol. 2) Transmission discussed with patient including sexual transmission, sharing razors and toothbrush.   3) Will need referral to gastroenterology after work up 4) Will need referral for substance abuse counseling: No.; Further work up to include urine drug screen  No. 5) Will prescribe appropriate medication based on genotype and coverage  6) Hepatitis A and B titers 7) Pneumovax vaccine previously given, by his report 8) Ovid screening with ultrasound 9) will follow up after starting medication

## 2019-02-24 LAB — COMPLETE METABOLIC PANEL WITH GFR
AG Ratio: 1.1 (calc) (ref 1.0–2.5)
ALT: 100 U/L — ABNORMAL HIGH (ref 9–46)
AST: 70 U/L — ABNORMAL HIGH (ref 10–35)
Albumin: 3.9 g/dL (ref 3.6–5.1)
Alkaline phosphatase (APISO): 78 U/L (ref 35–144)
BUN: 17 mg/dL (ref 7–25)
CO2: 26 mmol/L (ref 20–32)
Calcium: 9.3 mg/dL (ref 8.6–10.3)
Chloride: 103 mmol/L (ref 98–110)
Creat: 1 mg/dL (ref 0.70–1.25)
GFR, Est African American: 89 mL/min/{1.73_m2} (ref >=60)
GFR, Est Non African American: 77 mL/min/{1.73_m2} (ref >=60)
Globulin: 3.7 g/dL (calc) (ref 1.9–3.7)
Glucose, Bld: 114 mg/dL — ABNORMAL HIGH (ref 65–99)
Potassium: 4.2 mmol/L (ref 3.5–5.3)
Sodium: 138 mmol/L (ref 135–146)
Total Bilirubin: 0.7 mg/dL (ref 0.2–1.2)
Total Protein: 7.6 g/dL (ref 6.1–8.1)

## 2019-02-24 LAB — HEPATITIS B SURFACE ANTIGEN: Hepatitis B Surface Ag: NONREACTIVE

## 2019-02-24 LAB — CBC WITH DIFFERENTIAL/PLATELET
Absolute Monocytes: 564 cells/uL (ref 200–950)
Basophils Absolute: 33 cells/uL (ref 0–200)
Basophils Relative: 0.4 %
Eosinophils Absolute: 66 cells/uL (ref 15–500)
Eosinophils Relative: 0.8 %
HCT: 48.1 % (ref 38.5–50.0)
Hemoglobin: 16.9 g/dL (ref 13.2–17.1)
Lymphs Abs: 2623 cells/uL (ref 850–3900)
MCH: 33.6 pg — ABNORMAL HIGH (ref 27.0–33.0)
MCHC: 35.1 g/dL (ref 32.0–36.0)
MCV: 95.6 fL (ref 80.0–100.0)
MPV: 11 fL (ref 7.5–12.5)
Monocytes Relative: 6.8 %
Neutro Abs: 5013 cells/uL (ref 1500–7800)
Neutrophils Relative %: 60.4 %
Platelets: 189 10*3/uL (ref 140–400)
RBC: 5.03 10*6/uL (ref 4.20–5.80)
RDW: 11.6 % (ref 11.0–15.0)
Total Lymphocyte: 31.6 %
WBC: 8.3 10*3/uL (ref 3.8–10.8)

## 2019-02-24 LAB — PROTIME-INR
INR: 1.1
Prothrombin Time: 11 s (ref 9.0–11.5)

## 2019-02-24 LAB — HEPATITIS B SURFACE ANTIBODY,QUALITATIVE: Hep B S Ab: NONREACTIVE

## 2019-02-24 LAB — HIV ANTIBODY (ROUTINE TESTING W REFLEX): HIV 1&2 Ab, 4th Generation: NONREACTIVE

## 2019-02-24 LAB — HEPATITIS B CORE ANTIBODY, TOTAL: Hep B Core Total Ab: NONREACTIVE

## 2019-02-24 LAB — HEPATITIS C GENOTYPE

## 2019-02-24 LAB — HEPATITIS A ANTIBODY, TOTAL: Hepatitis A AB,Total: NONREACTIVE

## 2019-03-07 ENCOUNTER — Ambulatory Visit
Admission: RE | Admit: 2019-03-07 | Discharge: 2019-03-07 | Disposition: A | Discharge status: Home / Self Care | Payer: Medicare Other | Source: Ambulatory Visit | Attending: Internal Medicine | Admitting: Internal Medicine

## 2019-03-07 DIAGNOSIS — B182 Chronic viral hepatitis C: Secondary | ICD-10-CM

## 2019-03-07 DIAGNOSIS — K7460 Unspecified cirrhosis of liver: Secondary | ICD-10-CM

## 2019-03-12 ENCOUNTER — Other Ambulatory Visit: Payer: Self-pay | Admitting: Internal Medicine

## 2019-03-12 MED ORDER — LEDIPASVIR-SOFOSBUVIR 90-400 MG PO TABS: 1.0000 | ORAL_TABLET | Freq: Every day | ORAL | 2 refills | Status: DC

## 2019-03-16 ENCOUNTER — Telehealth: Payer: Self-pay | Admitting: Pharmacist

## 2019-03-16 ENCOUNTER — Encounter: Payer: Self-pay | Admitting: Pharmacy Technician

## 2019-03-16 MED FILL — LEDIPASVIR-SOFOSBUVIR 90-40: 90-400 | 28 days supply | Qty: 28 | Fill #0

## 2019-03-16 NOTE — Telephone Encounter (Signed)
[facility] Patient Advocate Encounter   I was successful in securing patient a $30,000 grant from [employer] to provide copayment coverage for Harvoni. This will make the out of pocket cost $0.     I have spoken with the patient and he will have the medication mailed to his home.    The billing information is:   RxBin: [id number] PCN: [id number] Member ID: [id number] Group ID: [id number] Dates of Eligibility: 02/14/2019 through 02/13/2020  Patient knows to call the office with questions or concerns.  Bartholomew Crews, CPhT Specialty Pharmacy Patient [facility] for Infectious Disease Phone: [phone] Fax: [phone] 03/16/2019 8:00 AM

## 2019-03-18 NOTE — Telephone Encounter (Signed)
Thank you, Dorothea Ogle!

## 2019-03-22 ENCOUNTER — Other Ambulatory Visit: Payer: Self-pay | Admitting: Pharmacist

## 2019-03-22 DIAGNOSIS — B182 Chronic viral hepatitis C: Secondary | ICD-10-CM

## 2019-04-06 MED FILL — LEDIPASVIR-SOFOSBUVIR 90-40: 90-400 | 28 days supply | Qty: 28 | Fill #1

## 2019-04-22 ENCOUNTER — Other Ambulatory Visit: Payer: Medicare Other

## 2019-04-22 ENCOUNTER — Ambulatory Visit: Payer: 59 | Admitting: Pharmacist

## 2019-04-22 ENCOUNTER — Other Ambulatory Visit: Payer: Self-pay

## 2019-04-22 DIAGNOSIS — B182 Chronic viral hepatitis C: Secondary | ICD-10-CM

## 2019-04-25 LAB — COMPREHENSIVE METABOLIC PANEL
AG Ratio: 1.3 (calc) (ref 1.0–2.5)
ALT: 23 U/L (ref 9–46)
AST: 20 U/L (ref 10–35)
Albumin: 4.1 g/dL (ref 3.6–5.1)
Alkaline phosphatase (APISO): 61 U/L (ref 35–144)
BUN: 14 mg/dL (ref 7–25)
CO2: 27 mmol/L (ref 20–32)
Calcium: 9.7 mg/dL (ref 8.6–10.3)
Chloride: 101 mmol/L (ref 98–110)
Creat: 0.89 mg/dL (ref 0.70–1.25)
Globulin: 3.2 g/dL (calc) (ref 1.9–3.7)
Glucose, Bld: 100 mg/dL — ABNORMAL HIGH (ref 65–99)
Potassium: 4.5 mmol/L (ref 3.5–5.3)
Sodium: 136 mmol/L (ref 135–146)
Total Bilirubin: 0.7 mg/dL (ref 0.2–1.2)
Total Protein: 7.3 g/dL (ref 6.1–8.1)

## 2019-04-25 LAB — HEPATITIS C RNA QUANTITATIVE
HCV Quantitative Log: <1.18 Log IU/mL
HCV RNA, PCR, QN: <15 IU/mL

## 2019-04-26 ENCOUNTER — Other Ambulatory Visit: Payer: Self-pay | Admitting: Pharmacist

## 2019-04-26 ENCOUNTER — Telehealth: Payer: Self-pay | Admitting: Pharmacist

## 2019-04-26 DIAGNOSIS — B182 Chronic viral hepatitis C: Secondary | ICD-10-CM

## 2019-04-26 NOTE — Progress Notes (Signed)
Hep C viral load for EOT appt.

## 2019-04-26 NOTE — Telephone Encounter (Signed)
Called patient to see how he is doing on Harvoni. Jimmy Wilson states he is doing well and takes the medication every day. Has not missed any doses. Reports he is on the 2nd month of Harvoni. Informed patient that his viral load was undetectable and liver enzymes were normal. Explained that he should keep taking the medication until end of treatment to remain undetectable. Patient understood everything discussed today. Patient agreed to come in for HCV RNA lab on Friday, April 9 at 8:45 AM. Will follow-up with patient via phone call the following week.  Julieta Bellini, 4th Year PharmD Candidate

## 2019-04-27 NOTE — Telephone Encounter (Signed)
Agree with Ife's assessment and plan.

## 2019-05-09 MED FILL — LEDIPASVIR-SOFOSBUVIR 90-40: 90-400 | 28 days supply | Qty: 28 | Fill #2

## 2019-05-10 ENCOUNTER — Telehealth: Payer: Self-pay | Admitting: Pharmacy Technician

## 2019-05-10 NOTE — Telephone Encounter (Signed)
[facility] Patient Advocate Encounter  I spoke with [place] at [employer].  They were requiring a diagnosis verifcation for Mr. Kutz to continue to receive copay assistance for his Hepatitis C medicaiton.   [employer] ID: [id number]  I gave her Chronic Viral Hepatitis C, ICD10 code B18.2.  She said this completed the needed information and he will continue to receive the needed funds.  Venida Jarvis. Nadara Mustard [place] Patient [facility] for Infectious Disease Phone: [phone] Fax:  [phone]

## 2019-06-17 ENCOUNTER — Other Ambulatory Visit: Payer: Self-pay

## 2019-06-17 ENCOUNTER — Other Ambulatory Visit: Payer: Medicare Other

## 2019-06-17 DIAGNOSIS — B182 Chronic viral hepatitis C: Secondary | ICD-10-CM

## 2019-06-22 ENCOUNTER — Other Ambulatory Visit: Payer: Self-pay | Admitting: Pharmacist

## 2019-06-22 ENCOUNTER — Telehealth: Payer: Self-pay

## 2019-06-22 DIAGNOSIS — B182 Chronic viral hepatitis C: Secondary | ICD-10-CM

## 2019-06-22 LAB — HEPATITIS C RNA QUANTITATIVE
HCV Quantitative Log: <1.18 Log IU/mL
HCV RNA, PCR, QN: <15 IU/mL

## 2019-06-22 NOTE — Telephone Encounter (Signed)
Called patient to let him know his viral load was undetectable. He reports not having any problems finishing up treatment and that he took 1 pill every day for the 3 months. He will come in for HCV RNA lab to definitively demonstrate cure on Friday, July 23 and 9:15 AM, and we will follow up with a phone call.  Vallery Sa, PharmD Candidate

## 2019-09-30 ENCOUNTER — Other Ambulatory Visit: Payer: Self-pay

## 2019-09-30 ENCOUNTER — Other Ambulatory Visit: Payer: Medicare Other

## 2019-09-30 DIAGNOSIS — B182 Chronic viral hepatitis C: Secondary | ICD-10-CM

## 2019-10-05 ENCOUNTER — Other Ambulatory Visit: Payer: Self-pay | Admitting: General Practice

## 2019-10-05 DIAGNOSIS — F17200 Nicotine dependence, unspecified, uncomplicated: Secondary | ICD-10-CM

## 2019-10-05 LAB — HEPATITIS C RNA QUANTITATIVE
HCV Quantitative Log: <1.18 Log IU/mL
HCV RNA, PCR, QN: <15 IU/mL

## 2019-10-28 ENCOUNTER — Ambulatory Visit
Admission: RE | Admit: 2019-10-28 | Discharge: 2019-10-28 | Disposition: A | Discharge status: Home / Self Care | Payer: Medicare Other | Source: Ambulatory Visit | Attending: General Practice | Admitting: General Practice

## 2019-10-28 DIAGNOSIS — F17200 Nicotine dependence, unspecified, uncomplicated: Secondary | ICD-10-CM

## 2021-07-09 ENCOUNTER — Other Ambulatory Visit: Payer: Self-pay | Admitting: Registered Nurse

## 2021-07-09 DIAGNOSIS — M79604 Pain in right leg: Secondary | ICD-10-CM

## 2021-07-12 ENCOUNTER — Other Ambulatory Visit: Payer: Medicare Other

## 2021-07-15 ENCOUNTER — Other Ambulatory Visit: Payer: Self-pay | Admitting: Registered Nurse

## 2021-07-15 DIAGNOSIS — M79604 Pain in right leg: Secondary | ICD-10-CM

## 2021-07-18 ENCOUNTER — Other Ambulatory Visit: Payer: Medicare Other

## 2021-07-18 ENCOUNTER — Ambulatory Visit
Admission: RE | Admit: 2021-07-18 | Discharge: 2021-07-18 | Disposition: A | Discharge status: Home / Self Care | Payer: Medicare Other | Source: Ambulatory Visit | Attending: Registered Nurse | Admitting: Registered Nurse

## 2021-07-18 DIAGNOSIS — M79604 Pain in right leg: Secondary | ICD-10-CM

## 2021-10-22 ENCOUNTER — Ambulatory Visit: Payer: Medicare Other | Admitting: Urology

## 2021-10-22 ENCOUNTER — Encounter: Payer: Self-pay | Admitting: Urology

## 2021-10-22 VITALS — BP 178/82 | HR 84 | Ht 75.0 in | Wt 187.0 lb

## 2021-10-22 DIAGNOSIS — N401 Enlarged prostate with lower urinary tract symptoms: Secondary | ICD-10-CM | POA: Diagnosis not present

## 2021-10-22 DIAGNOSIS — R3915 Urgency of urination: Secondary | ICD-10-CM

## 2021-10-22 DIAGNOSIS — R972 Elevated prostate specific antigen [PSA]: Secondary | ICD-10-CM

## 2021-10-22 DIAGNOSIS — M5030 Other cervical disc degeneration, unspecified cervical region: Secondary | ICD-10-CM | POA: Insufficient documentation

## 2021-10-22 DIAGNOSIS — M1990 Unspecified osteoarthritis, unspecified site: Secondary | ICD-10-CM | POA: Insufficient documentation

## 2021-10-22 HISTORY — DX: Unspecified osteoarthritis, unspecified site: M19.90

## 2021-10-22 NOTE — Progress Notes (Signed)
10/22/21 3:36 PM   Jimmy Wilson 1951/03/01 [id number]  Referring provider:  Arthur Holms, NP [address] [place],  [place] [address] Chief Complaint  Patient presents with   Elevated PSA      HPI: Jimmy Wilson is a 71 y.o.male who presents today for further evaluation of elevated PSA.   He was seen by his PCP Arthur Holms, NP on 10/07/2021. His PSA was drawn and was elevated at 20.59.  Prior to this, it was 18.47 on 07/09/2021 and this was the repeat value.  He believes that his PSA has been checked all along over the past 10 years but he does not recall the actual numbers and this was not available to [place] today.  He does recall about 10 years ago being told his PSA was elevated, treat with a month long of antibiotics and PSA was repeated and it returned back to acceptable number.  He does have some baseline urinary symptoms.  Family history includes family history prostate cancer in his grandfather who died at age 4 but not of prostate cancer but following a prostate procedure.  He also has maternal family history of breast cancer.   IPSS     Row Name 10/22/21 1300         International Prostate Symptom Score   How often have you had the sensation of not emptying your bladder? About half the time     How often have you had to urinate less than every two hours? About half the time     How often have you found you stopped and started again several times when you urinated? Less than half the time     How often have you found it difficult to postpone urination? Less than 1 in 5 times     How often have you had to strain to start urination? Not at All     How many times did you typically get up at night to urinate? 2 Times     Total IPSS Score 11       Quality of Life due to urinary symptoms   If you were to spend the rest of your life with your urinary condition just the way it is now how would you feel about that? Mostly Satisfied              Score:  1-7 Mild 8-19  Moderate 20-35 Severe   PMH: Past Medical History:  Diagnosis Date   Arthritis 10/22/2021   BACK PAIN 10/31/2008   Qualifier: Diagnosis of  By: Lorelei Pont MD, [place]     Chronic hepatitis C without hepatic coma ([place]) 02/21/2019   Cirrhosis ([place]) 02/21/2019   COPD (chronic obstructive pulmonary disease) ([place])    HYPERTENSION 10/31/2008   Qualifier: Diagnosis of  By: Lorelei Pont MD, Spencer     PSA, INCREASED 03/19/2009   Qualifier: Diagnosis of  By: Lorelei Pont MD, Spencer      Surgical History: Past Surgical History:  Procedure Laterality Date   NECK SURGERY      Home Medications:  Allergies as of 10/22/2021       Reactions   Aspirin Hypertension        Medication List        Accurate as of October 22, 2021  3:36 PM. If you have any questions, ask your nurse or doctor.          STOP taking these medications    Ledipasvir-Sofosbuvir 90-400 MG Tabs Commonly known as:  Harvoni Stopped by: Hollice Espy, MD       TAKE these medications    lisinopril-hydrochlorothiazide 10-12.5 MG tablet Commonly known as: ZESTORETIC Take 1 tablet by mouth daily.   rosuvastatin 5 MG tablet Commonly known as: CRESTOR SMARTSIG:1 Tablet(s) By Mouth Every Evening        Allergies:  Allergies  Allergen Reactions   Aspirin Hypertension    Family History: Family History  Problem Relation Age of Onset   Prostate cancer Neg Hx    Bladder Cancer Neg Hx    Kidney cancer Neg Hx     Social History:  reports that he has been smoking cigarettes. He has been smoking an average of 1.5 packs per day. He has never used smokeless tobacco. He reports current alcohol use of about 2.0 - 3.0 standard drinks of alcohol per week. He reports that he does not use drugs.   Physical Exam: BP (!) 178/82   Pulse 84   Ht '6\' 3"'$  (1.905 m)   Wt 187 lb (84.8 kg)   BMI 23.37 kg/m   Constitutional:  Alert and oriented, No acute distress. HEENT: [place] AT, moist mucus membranes.  Trachea midline, no  masses. Cardiovascular: No clubbing, cyanosis, or edema. Respiratory: Normal respiratory effort, no increased work of breathing. Rectal: Normal sphincter tone.  50-60 cc prostate, nontender no nodules Skin: No rashes, bruises or suspicious lesions. Neurologic: Grossly intact, no focal deficits, moving all 4 extremities. Psychiatric: Normal mood and affect.  Laboratory Data:  Lab Results  Component Value Date   CREATININE 0.89 04/22/2019    Urinalysis Checked at PCP, negative   Assessment & Plan:    Elevated PSA  -  We reviewed the implications of an elevated PSA and the uncertainty surrounding it. In general, a man's PSA increases with age and is produced by both normal and cancerous prostate tissue. Differential for elevated PSA is BPH, prostate cancer, infection, recent intercourse/ejaculation, prostate infarction, recent urethroscopic manipulation (foley placement/cystoscopy) and prostatitis. Management of an elevated PSA can include observation or prostate biopsy and wediscussed this in detail. We discussed that indications for prostate biopsy are defined by age and race specific PSA cutoffs as well as a PSA velocity of 0.75/year.  -Plan to repeat PSA.  Also requested additional PSA values from PCP.  -Rectal exam reassuring  -We discussed various options moving forward.  Depending on his repeat PSA, could consider prostate biopsy.  We discussed the risk of this and today including the risk of infection, blood in the urine stool and ejaculate fluid amongst others.Alternatively, could also consider an MRI.  Will call tomorrow with recs  2. BPH with urgency -Relatively low bother -will continue to follow  Conley Rolls as a scribe for Hollice Espy, MD.,have documented all relevant documentation on the behalf of Hollice Espy, MD,as directed by  Hollice Espy, MD while in the presence of Hollice Espy, MD.  I have reviewed the above documentation for accuracy and  completeness, and I agree with the above.   Hollice Espy, MD   [facility] [address] [place], [place] [address] [phone]

## 2021-10-22 NOTE — Patient Instructions (Signed)

## 2021-10-23 ENCOUNTER — Telehealth: Payer: Self-pay | Admitting: *Deleted

## 2021-10-23 LAB — PSA: Prostate Specific Ag, Serum: 23.9 ng/mL — ABNORMAL HIGH (ref 0.0–4.0)

## 2021-10-23 NOTE — Telephone Encounter (Addendum)
Patient informed, scheduled biopsy. Voiced understanding    ----- Message from Hollice Espy, MD sent at 10/23/2021  8:11 AM EDT ----- PSA continues to trend upward.  Strongly recommend prostate biopsy.  He was given instructions yesterday.  Please inform him and book the procedure.  Hollice Espy, MD

## 2021-11-05 ENCOUNTER — Encounter: Payer: Self-pay | Admitting: Urology

## 2021-11-05 ENCOUNTER — Ambulatory Visit: Payer: Medicare Other | Admitting: Urology

## 2021-11-05 VITALS — BP 168/82 | HR 88 | Ht 75.0 in | Wt 187.6 lb

## 2021-11-05 DIAGNOSIS — R972 Elevated prostate specific antigen [PSA]: Secondary | ICD-10-CM | POA: Diagnosis not present

## 2021-11-05 DIAGNOSIS — C61 Malignant neoplasm of prostate: Secondary | ICD-10-CM

## 2021-11-05 MED ORDER — GENTAMICIN SULFATE 40 MG/ML IJ SOLN
80.0000 mg | Freq: Once | INTRAMUSCULAR | Status: AC
  Administered 2021-11-05: 80 mg via INTRAMUSCULAR

## 2021-11-05 MED ORDER — LEVOFLOXACIN 500 MG PO TABS
500.0000 mg | ORAL_TABLET | Freq: Once | ORAL | Status: AC
  Administered 2021-11-05: 500 mg via ORAL

## 2021-11-05 NOTE — Progress Notes (Signed)
   11/05/21  CC:  Chief Complaint  Patient presents with   Prostate Biopsy    HPI: 72 year old male with upper trending PSA, most recently up to 23.9 he presents today for prostate biopsy.  Rectal exam with prostamegaly but otherwise benign.   NED. A&Ox3.   No respiratory distress   Abd soft, NT, ND Normal sphincter tone  Prostate Biopsy Procedure   Informed consent was obtained after discussing risks/benefits of the procedure.  A time out was performed to ensure correct patient identity.  Pre-Procedure: - Last PSA Level:  Lab Results  Component Value Date   PSA 3.25 04/18/2009   PSA 5.76 (H) 03/15/2009   - Gentamicin given prophylactically - Levaquin 500 mg administered PO -Transrectal Ultrasound performed revealing a 86.9 gm prostate -No significant hypoechoic or median lobe noted  Procedure: - Prostate block performed using 10 cc 1% lidocaine and biopsies taken from sextant areas, a total of 12 under ultrasound guidance.  Post-Procedure: - Patient tolerated the procedure well - He was counseled to seek immediate medical attention if experiences any severe pain, significant bleeding, or fevers - Return in one week to discuss biopsy results    Hollice Espy, MD

## 2021-11-07 LAB — SURGICAL PATHOLOGY

## 2021-11-12 ENCOUNTER — Ambulatory Visit: Payer: Medicare Other | Admitting: Urology

## 2021-11-12 ENCOUNTER — Encounter: Payer: Self-pay | Admitting: Urology

## 2021-11-12 VITALS — BP 163/70 | HR 76 | Ht 75.0 in | Wt 187.0 lb

## 2021-11-12 DIAGNOSIS — C61 Malignant neoplasm of prostate: Secondary | ICD-10-CM

## 2021-11-12 DIAGNOSIS — R972 Elevated prostate specific antigen [PSA]: Secondary | ICD-10-CM

## 2021-11-12 NOTE — Progress Notes (Signed)
11/12/2021 4:56 PM   Jimmy Wilson 04/03/1950 [id number]  Referring provider: Arthur Holms, NP [place],  [place] [address]  Chief Complaint  Patient presents with   Results    HPI: 71 year old male who presents today to discuss newly diagnosed prostate cancer.  Please see previous notes for details.  In summary, he presented with a PSA of just over 20, repeat 23.9.  Rectal exam was unremarkable.  TRUS volume 86.9.  He does have baseline urinary symptoms as well as erectile dysfunction.  He mentions today that he is not particularly interested in surgery if he is a candidate for that.  Both his mother and grandfather died shortly after surgical procedure at a more advanced age.  He also mentions today that he was thinking about selling his house and moving to [place].      IPSS     Row Name 11/12/21 1600         International Prostate Symptom Score   How often have you had the sensation of not emptying your bladder? More than half the time     How often have you had to urinate less than every two hours? More than half the time     How often have you found you stopped and started again several times when you urinated? Less than half the time     How often have you found it difficult to postpone urination? Less than half the time     How often have you had a weak urinary stream? About half the time     How often have you had to strain to start urination? Less than half the time     How many times did you typically get up at night to urinate? 2 Times     Total IPSS Score 19       Quality of Life due to urinary symptoms   If you were to spend the rest of your life with your urinary condition just the way it is now how would you feel about that? Mostly Satisfied              Score:  1-7 Mild 8-19 Moderate 20-35 Severe   SHIM     Row Name 11/12/21 1614         SHIM: Over the last 6 months:   How do you rate your confidence that you could get and  keep an erection? Very Low     When you had erections with sexual stimulation, how often were your erections hard enough for penetration (entering your partner)? A Few Times (much less than half the time)     During sexual intercourse, how often were you able to maintain your erection after you had penetrated (entered) your partner? A Few Times (much less than half the time)     During sexual intercourse, how difficult was it to maintain your erection to completion of intercourse? Very Difficult     When you attempted sexual intercourse, how often was it satisfactory for you? A Few Times (much less than half the time)       SHIM Total Score   SHIM 9                PMH: Past Medical History:  Diagnosis Date   Arthritis 10/22/2021   BACK PAIN 10/31/2008   Qualifier: Diagnosis of  By: Lorelei Pont MD, [place]     Chronic hepatitis C without hepatic coma ([place]) 02/21/2019  Cirrhosis ([place]) 02/21/2019   COPD (chronic obstructive pulmonary disease) ([place])    HYPERTENSION 10/31/2008   Qualifier: Diagnosis of  By: Lorelei Pont MD, [place]     PSA, INCREASED 03/19/2009   Qualifier: Diagnosis of  By: Lorelei Pont MD, [place]      Surgical History: Past Surgical History:  Procedure Laterality Date   NECK SURGERY      Home Medications:  Allergies as of 11/12/2021       Reactions   Aspirin Hypertension        Medication List        Accurate as of November 12, 2021  4:56 PM. If you have any questions, ask your nurse or doctor.          lisinopril-hydrochlorothiazide 10-12.5 MG tablet Commonly known as: ZESTORETIC Take 1 tablet by mouth daily.   rosuvastatin 5 MG tablet Commonly known as: CRESTOR SMARTSIG:1 Tablet(s) By Mouth Every Evening        Allergies:  Allergies  Allergen Reactions   Aspirin Hypertension    Family History: Family History  Problem Relation Age of Onset   Prostate cancer Neg Hx    Bladder Cancer Neg Hx    Kidney cancer Neg Hx     Social History:   reports that he has been smoking cigarettes. He has been smoking an average of 1.5 packs per day. He has never used smokeless tobacco. He reports current alcohol use of about 2.0 - 3.0 standard drinks of alcohol per week. He reports that he does not use drugs.   Physical Exam: BP (!) 163/70   Pulse 76   Ht '6\' 3"'$  (1.905 m)   Wt 187 lb (84.8 kg)   BMI 23.37 kg/m   Constitutional:  Alert and oriented, No acute distress. HEENT: [place] AT, moist mucus membranes.  Trachea midline, no masses. Cardiovascular: No clubbing, cyanosis, or edema. Respiratory: Normal respiratory effort, no increased work of breathing. Neurologic: Grossly intact, no focal deficits, moving all 4 extremities. Psychiatric: Normal mood and affect.  Laboratory Data: Lab Results  Component Value Date   WBC 8.3 02/21/2019   HGB 16.9 02/21/2019   HCT 48.1 02/21/2019   MCV 95.6 02/21/2019   PLT 189 02/21/2019    Lab Results  Component Value Date   CREATININE 0.89 04/22/2019    Assessment & Plan:    1. Prostate cancer [facility]) Newly diagnosed prostate cancer, high-volume favorable intermediate risk however based on his PSA, he actually falls into the high risk range  As such, would recommend PSMA PET scan for staging purposes.  He is agreeable with this plan.  The patient was counseled about the natural history of prostate cancer and the standard treatment options that are available for prostate cancer. It was explained to him how his age and life expectancy, clinical stage, Gleason score, and PSA affect his prognosis, the decision to proceed with additional staging studies, as well as how that information influences recommended treatment strategies. We discussed the roles for active surveillance, radiation therapy, surgical therapy, androgen deprivation, as well as ablative therapy options for the treatment of prostate cancer as appropriate to his individual cancer situation. We discussed the risks and benefits of these  options with regard to their impact on cancer control and also in terms of potential adverse events, complications, and impact on quality of life particularly related to urinary, bowel, and sexual function. The patient was encouraged to ask questions throughout the discussion today and all questions were answered to his stated satisfaction. In  addition, the patient was provided with and/or directed to appropriate resources and literature for further education about prostate cancer treatment options.   We did briefly go ahead and discussed treatment options should this be localized to the prostate.  He is adamantly against the idea of surgery.  He may be interested in radiation with either short versus long-term ADT.  We discussed the side effects of ADT including hot flashes, sexual side effects, weight gain, etc.  We will have him follow-up with me with the PET scan results, if it is localized he would like a referral to radiation oncology.   - NM PET (PSMA) SKULL TO MID THIGH; Future   Return in about 3 weeks (around 12/03/2021) for Pet scan.  Hollice Espy, MD  [facility] [address] [place], [place] [address] [phone]

## 2021-11-27 ENCOUNTER — Ambulatory Visit
Admission: RE | Admit: 2021-11-27 | Discharge: 2021-11-27 | Disposition: A | Discharge status: Home / Self Care | Payer: Medicare Other | Source: Ambulatory Visit | Attending: Urology | Admitting: Urology

## 2021-11-27 DIAGNOSIS — R972 Elevated prostate specific antigen [PSA]: Secondary | ICD-10-CM | POA: Diagnosis present

## 2021-11-27 DIAGNOSIS — C61 Malignant neoplasm of prostate: Secondary | ICD-10-CM | POA: Diagnosis not present

## 2021-11-27 MED ORDER — PIFLIFOLASTAT F 18 (PYLARIFY) INJECTION
9.0000 | Freq: Once | INTRAVENOUS | Status: AC
  Administered 2021-11-27: 9.7 via INTRAVENOUS

## 2021-12-03 ENCOUNTER — Encounter: Payer: Self-pay | Admitting: Urology

## 2021-12-03 ENCOUNTER — Ambulatory Visit: Payer: Medicare Other | Admitting: Urology

## 2021-12-03 VITALS — BP 159/89 | HR 67 | Ht 75.0 in | Wt 192.4 lb

## 2021-12-03 DIAGNOSIS — C61 Malignant neoplasm of prostate: Secondary | ICD-10-CM

## 2021-12-03 NOTE — Progress Notes (Unsigned)
12/03/2021 4:13 PM   Jimmy Wilson [birth year] [id number]  Referring provider: Arthur Holms, NP [place],  [place] [address]  Chief Complaint  Patient presents with   Follow-up    HPI:    PMH: Past Medical History:  Diagnosis Date   Arthritis 10/22/2021   BACK PAIN 10/31/2008   Qualifier: Diagnosis of  By: Lorelei Pont MD, [place]     Chronic hepatitis C without hepatic coma ([place]) 02/21/2019   Cirrhosis ([place]) 02/21/2019   COPD (chronic obstructive pulmonary disease) ([place])    HYPERTENSION 10/31/2008   Qualifier: Diagnosis of  By: Lorelei Pont MD, [place]     PSA, INCREASED 03/19/2009   Qualifier: Diagnosis of  By: Lorelei Pont MD, [place]      Surgical History: Past Surgical History:  Procedure Laterality Date   NECK SURGERY      Home Medications:  Allergies as of 12/03/2021       Reactions   Aspirin Hypertension        Medication List        Accurate as of December 03, 2021  4:13 PM. If you have any questions, ask your nurse or doctor.          lisinopril-hydrochlorothiazide 10-12.5 MG tablet Commonly known as: ZESTORETIC Take 1 tablet by mouth daily.   rosuvastatin 5 MG tablet Commonly known as: CRESTOR SMARTSIG:1 Tablet(s) By Mouth Every Evening        Allergies:  Allergies  Allergen Reactions   Aspirin Hypertension    Family History: Family History  Problem Relation Age of Onset   Prostate cancer Neg Hx    Bladder Cancer Neg Hx    Kidney cancer Neg Hx     Social History:  reports that he has been smoking cigarettes. He has been smoking an average of 1.5 packs per day. He has never used smokeless tobacco. He reports current alcohol use of about 2.0 - 3.0 standard drinks of alcohol per week. He reports that he does not use drugs.   Physical Exam: BP (!) 159/89   Pulse 67   Ht '6\' 3"'$  (1.905 m)   Wt 192 lb 6.4 oz (87.3 kg)   BMI 24.05 kg/m   Constitutional:  Alert and oriented, No acute distress. HEENT: [place] AT, moist mucus  membranes.  Trachea midline, no masses. Cardiovascular: No clubbing, cyanosis, or edema. Respiratory: Normal respiratory effort, no increased work of breathing. GI: Abdomen is soft, nontender, nondistended, no abdominal masses GU: No CVA tenderness Skin: No rashes, bruises or suspicious lesions. Neurologic: Grossly intact, no focal deficits, moving all 4 extremities. Psychiatric: Normal mood and affect.  Laboratory Data: Lab Results  Component Value Date   WBC 8.3 02/21/2019   HGB 16.9 02/21/2019   HCT 48.1 02/21/2019   MCV 95.6 02/21/2019   PLT 189 02/21/2019    Lab Results  Component Value Date   CREATININE 0.89 04/22/2019    Lab Results  Component Value Date   PSA 3.25 04/18/2009   PSA 5.76 (H) 03/15/2009    No results found for: "TESTOSTERONE"  No results found for: "HGBA1C"  Urinalysis    Component Value Date/Time   COLORURINE YELLOW (A) 06/23/2018 0946   APPEARANCEUR CLEAR (A) 06/23/2018 0946   APPEARANCEUR Clear 01/12/2012 0939   LABSPEC 1.017 06/23/2018 0946   LABSPEC 1.015 01/12/2012 [place] 5.0 06/23/2018 [place] 06/23/2018 0946   GLUCOSEU Negative 01/12/2012 [place] 06/23/2018 [place] 06/23/2018 0946  BILIRUBINUR Negative 01/12/2012 [place] 06/23/2018 0946   PROTEINUR NEGATIVE 06/23/2018 0946   NITRITE NEGATIVE 06/23/2018 0946   LEUKOCYTESUR NEGATIVE 06/23/2018 0946   LEUKOCYTESUR Negative 01/12/2012 0939    Lab Results  Component Value Date   BACTERIA NONE SEEN 06/23/2018    Pertinent Imaging: *** No results found for this or any previous visit.  No results found for this or any previous visit.  No results found for this or any previous visit.  No results found for this or any previous visit.  No results found for this or any previous visit.  No valid procedures specified. No results found for this or any previous visit.  Results for orders placed during  the hospital encounter of 06/23/18  CT RENAL STONE STUDY  Narrative CLINICAL DATA:  Left flank pain for 2 days. Stone disease suspected.  EXAM: CT ABDOMEN AND PELVIS WITHOUT CONTRAST  TECHNIQUE: Multidetector CT imaging of the abdomen and pelvis was performed following the standard protocol without IV contrast.  COMPARISON:  Limited correlation made with a chest CT 05/19/2008.  FINDINGS: Lower chest: The previously demonstrated pleural effusions and basilar airspace opacities have resolved. There is mild linear scarring in the right middle lobe and a small calcified right lower lobe granuloma. No suspicious pulmonary nodules.  Hepatobiliary: There is diffuse contour irregularity of the liver suspicious for underlying cirrhosis. No focal lesion identified on noncontrast imaging. No evidence of gallstones, gallbladder wall thickening or biliary dilatation.  Pancreas: Unremarkable. No pancreatic ductal dilatation or surrounding inflammatory changes.  Spleen: Normal in size without focal abnormality.  Adrenals/Urinary Tract: Both adrenal glands appear normal. There are 2 probable left renal cysts, measuring up to 16 mm in the lower pole (image 29/2). No evidence of urinary tract calculus or hydronephrosis. Mild bladder wall thickening may in part be secondary to incomplete bladder distension.  Stomach/Bowel: No evidence of bowel wall thickening, distention or surrounding inflammatory change. The appendix is not confidently visualized, although there is no pericecal inflammation. Minimal sigmoid diverticulosis.  Vascular/Lymphatic: There are multiple prominent lymph nodes in the upper abdomen, including a portacaval node measuring 13 mm on image 24/2. There is moderate diffuse aortic and branch vessel atherosclerosis.  Reproductive: The prostate gland is mildly enlarged without focal abnormality.  Other: No ascites or generalized peritoneal nodularity. There are 2 soft  tissue nodules in the right posterior para hepatic fat, measuring up to 9 mm on image 27/2. The other nodule is seen on image 13/2.  Musculoskeletal: No acute or significant osseous findings. There are moderate degenerative changes throughout the lumbar spine associated with a convex left scoliosis.  IMPRESSION: 1. No evidence of urinary tract calculus or hydronephrosis. No clear explanation for left flank pain. 2. Findings consistent with hepatic cirrhosis. Prominent lymph nodes in porta hepatis are likely reactive, although not entirely specific. Further clinical evaluation or imaging follow-up may be warranted. 3. Indeterminate soft tissue nodularity posterior to the liver. 4. Incidental findings including moderate Aortic Atherosclerosis (ICD10-I70.0), prostatomegaly, lumbar spondylosis, left renal cysts and possible bladder wall thickening.   Electronically Signed By: Richardean Sale M.D. On: 06/23/2018 10:28   Assessment & Plan:    There are no diagnoses linked to this encounter.  No follow-ups on file.  Hollice Espy, MD  [facility] [address] [place], [place] [address] [phone]

## 2021-12-11 NOTE — Progress Notes (Signed)
GU Location of Tumor / Histology: Prostate Ca  If Prostate Cancer, Gleason Score is (3 + 4) and PSA is (23.9 on 11/12/2021)  Biopsies  Dr. Hollice Espy   11/27/2021 Dr. Hollice Espy NM PET (PSMA) Skull To Mid Thigh CLINICAL DATA:  High-risk prostate carcinoma.  FINDINGS: NECK No radiotracer activity in neck lymph nodes. Incidental CT finding: None.   CHEST No radiotracer accumulation within mediastinal or hilar lymph nodes.  No suspicious pulmonary nodules on the CT scan.   Incidental CT finding: Aortic and coronary atherosclerotic calcification incidentally noted.   ABDOMEN/PELVIS Prostate: Moderately enlarged prostate gland. Asymmetric radiotracer accumulation is seen within the peripheral zone of the prostate gland, right side greater than left, with SUV max of 5.5. This is consistent with primary prostate carcinoma.   Lymph nodes: No abnormal radiotracer accumulation within pelvic or abdominal nodes.   Liver: No evidence of liver metastasis.   Incidental CT finding: None.   SKELETON No focal activity to suggest skeletal metastasis.   IMPRESSION: Asymmetric radiotracer accumulation within the prostatic peripheral zone, right side greater than left, consistent with primary prostate carcinoma.  No evidence of local or distant metastatic disease.   Past/Anticipated interventions by urology, if any: NA  Past/Anticipated interventions by medical oncology, if any: NA  Weight changes, if any:  No  IPSS:  13 SHIM:  10  Bowel/Bladder complaints, if any:  No  Nausea/Vomiting, if any: No  Pain issues, if any:  7/10 bilateral legs  SAFETY ISSUES: Prior radiation?  No Pacemaker/ICD? No Possible current pregnancy? Male Is the patient on methotrexate? No  Current Complaints / other details:  Need more information on treatment options.

## 2021-12-16 NOTE — Progress Notes (Signed)
Radiation Oncology         [phone]) [phone] ________________________________  Initial Outpatient Consultation  Name: Jimmy Wilson MRN: [id number]  Date: 12/17/2021  DOB: 1950/09/22  NW:GNFAOZ, Maudie Mercury, NP  Hollice Espy, MD   REFERRING PHYSICIAN: Hollice Espy, MD  DIAGNOSIS: 71 y.o. gentleman with Stage T1c adenocarcinoma of the prostate with Gleason score of 3+4, and PSA of 23.9.    ICD-10-CM   1. Malignant neoplasm of prostate ([place])  C61 Ambulatory referral to Radiation Oncology      HISTORY OF PRESENT ILLNESS: Jimmy Wilson is a 71 y.o. male with a diagnosis of prostate cancer. He was noted to have an elevated PSA of 18.47 in 07/2021 and remained elevated at 20.59 on repeat labs 10/07/21, by his primary care provider, Arthur Holms, NP.  Accordingly, he was referred for evaluation in urology by Dr. Erlene Quan on 10/22/21,  digital rectal examination was performed at that time showing no nodules. Repeat PSA obtained that day showed a further rise to 23.9. The patient proceeded to transrectal ultrasound with 12 biopsies of the prostate on 11/05/21.  The prostate volume measured 86.9 cc.  Out of 12 core biopsies, 11 were positive.  The maximum Gleason score was 3+4, and this was seen in the right lateral apex, right lateral mid, right lateral base, left lateral mid, right mid, and left mid.  He underwent disease staging PSMA PET scan on 11/27/21 showing asymmetric radiotracer accumulation within the prostatic peripheral zone, right greater than left; without evidence of local or distant metastatic disease.  The patient reviewed the biopsy results with his urologist and he has kindly been referred today for discussion of potential radiation treatment options.   PREVIOUS RADIATION THERAPY: No  PAST MEDICAL HISTORY:  Past Medical History:  Diagnosis Date   Arthritis 10/22/2021   BACK PAIN 10/31/2008   Qualifier: Diagnosis of  By: Lorelei Pont MD, [place]     Chronic hepatitis C without hepatic coma  ([place]) 02/21/2019   Cirrhosis ([place]) 02/21/2019   COPD (chronic obstructive pulmonary disease) ([place])    HYPERTENSION 10/31/2008   Qualifier: Diagnosis of  By: Lorelei Pont MD, Spencer     PSA, INCREASED 03/19/2009   Qualifier: Diagnosis of  By: Lorelei Pont MD, Frederico Hamman        PAST SURGICAL HISTORY: Past Surgical History:  Procedure Laterality Date   NECK SURGERY      FAMILY HISTORY:  Family History  Problem Relation Age of Onset   Prostate cancer Neg Hx    Bladder Cancer Neg Hx    Kidney cancer Neg Hx     SOCIAL HISTORY:  Social History   Socioeconomic History   Marital status: Single    Spouse name: Not on file   Number of children: Not on file   Years of education: Not on file   Highest education level: Not on file  Occupational History   Not on file  Tobacco Use   Smoking status: Every Day    Packs/day: 1.50    Types: Cigarettes   Smokeless tobacco: Never  Vaping Use   Vaping Use: Never used  Substance and Sexual Activity   Alcohol use: Yes    Alcohol/week: 2.0 - 3.0 standard drinks of alcohol    Types: 2 - 3 Cans of beer per week    Comment: 3x per week   Drug use: No   Sexual activity: Not on file  Other Topics Concern   Not on file  Social History Narrative   Not on  file   Social Determinants of Health   Financial Resource Strain: Low Risk  (12/18/2021)   Overall Financial Resource Strain (CARDIA)    Difficulty of Paying Living Expenses: Not very hard  Food Insecurity: No Food Insecurity (12/18/2021)   Hunger Vital Sign    Worried About Running Out of Food in the Last Year: Never true    Ran Out of Food in the Last Year: Never true  Transportation Needs: No Transportation Needs (12/18/2021)   PRAPARE - Hydrologist (Medical): No    Lack of Transportation (Non-Medical): No  Physical Activity: Not on file  Stress: Not on file  Social Connections: Not on file  Intimate Partner Violence: Not At Risk (12/18/2021)   Humiliation,  Afraid, Rape, and Kick questionnaire    Fear of Current or Ex-Partner: No    Emotionally Abused: No    Physically Abused: No    Sexually Abused: No    ALLERGIES: Aspirin  MEDICATIONS:  Current Outpatient Medications  Medication Sig Dispense Refill   lisinopril-hydrochlorothiazide (ZESTORETIC) 10-12.5 MG tablet Take 1 tablet by mouth daily.     rosuvastatin (CRESTOR) 5 MG tablet SMARTSIG:1 Tablet(s) By Mouth Every Evening     No current facility-administered medications for this encounter.    REVIEW OF SYSTEMS:  On review of systems, the patient reports that he is doing well overall. He denies any chest pain, shortness of breath, cough, fevers, chills, night sweats, unintended weight changes. He denies any bowel disturbances, and denies abdominal pain, nausea or vomiting. He reports pain to both legs, rated 7/10. His IPSS was 13, indicating moderate urinary symptoms. His SHIM was 10, indicating he has moderate erectile dysfunction. A complete review of systems is obtained and is otherwise negative.    PHYSICAL EXAM:  Wt Readings from Last 3 Encounters:  12/17/21 191 lb 9.6 oz (86.9 kg)  12/03/21 192 lb 6.4 oz (87.3 kg)  11/12/21 187 lb (84.8 kg)   Temp Readings from Last 3 Encounters:  12/17/21 98.1 F (36.7 C)  06/23/18 97.6 F (36.4 C) (Oral)   BP Readings from Last 3 Encounters:  12/17/21 (!) 150/77  12/03/21 (!) 159/89  11/12/21 (!) 163/70   Pulse Readings from Last 3 Encounters:  12/17/21 75  12/03/21 67  11/12/21 76   Pain Assessment Pain Score: 7  Pain Loc: Leg (bilateral legs)/10  In general this is a well appearing Caucasian male in no acute distress. He's alert and oriented x4 and appropriate throughout the examination. Cardiopulmonary assessment is negative for acute distress, and he exhibits normal effort.     KPS = 100  100 - Normal; no complaints; no evidence of disease. 90   - Able to carry on normal activity; minor signs or symptoms of disease. 80    - Normal activity with effort; some signs or symptoms of disease. 44   - Cares for self; unable to carry on normal activity or to do active work. 60   - Requires occasional assistance, but is able to care for most of his personal needs. 50   - Requires considerable assistance and frequent medical care. 67   - Disabled; requires special care and assistance. 71   - Severely disabled; hospital admission is indicated although death not imminent. 46   - Very sick; hospital admission necessary; active supportive treatment necessary. 10   - Moribund; fatal processes progressing rapidly. 0     - Dead  Karnofsky DA, Abelmann WH, Craver LS and  Burchenal JH [phone]) The use of the nitrogen mustards in the palliative treatment of carcinoma: with particular reference to bronchogenic carcinoma Cancer 1 634-56  LABORATORY DATA:  Lab Results  Component Value Date   WBC 8.3 02/21/2019   HGB 16.9 02/21/2019   HCT 48.1 02/21/2019   MCV 95.6 02/21/2019   PLT 189 02/21/2019   Lab Results  Component Value Date   NA 136 04/22/2019   K 4.5 04/22/2019   CL 101 04/22/2019   CO2 27 04/22/2019   Lab Results  Component Value Date   ALT 23 04/22/2019   AST 20 04/22/2019   ALKPHOS 94 06/23/2018   BILITOT 0.7 04/22/2019     RADIOGRAPHY: NM PET (PSMA) SKULL TO MID THIGH  Result Date: 11/28/2021 CLINICAL DATA:  High-risk prostate carcinoma. EXAM: NUCLEAR MEDICINE PET SKULL BASE TO THIGH TECHNIQUE: 9.7 mCi F18 Piflufolastat (Pylarify) was injected intravenously. Full-ring PET imaging was performed from the skull base to thigh after the radiotracer. CT data was obtained and used for attenuation correction and anatomic localization. COMPARISON:  None Available. FINDINGS: NECK No radiotracer activity in neck lymph nodes. Incidental CT finding: None. CHEST No radiotracer accumulation within mediastinal or hilar lymph nodes. No suspicious pulmonary nodules on the CT scan. Incidental CT finding: Aortic and coronary  atherosclerotic calcification incidentally noted. ABDOMEN/PELVIS Prostate: Moderately enlarged prostate gland. Asymmetric radiotracer accumulation is seen within the peripheral zone of the prostate gland, right side greater than left, with SUV max of 5.5. This is consistent with primary prostate carcinoma. Lymph nodes: No abnormal radiotracer accumulation within pelvic or abdominal nodes. Liver: No evidence of liver metastasis. Incidental CT finding: None. SKELETON No focal activity to suggest skeletal metastasis. IMPRESSION: Asymmetric radiotracer accumulation within the prostatic peripheral zone, right side greater than left, consistent with primary prostate carcinoma. No evidence of local or distant metastatic disease. Electronically Signed   By: Marlaine Hind M.D.   On: 11/28/2021 11:58      IMPRESSION/PLAN: 1. 71 y.o. gentleman with Stage T1c adenocarcinoma of the prostate with Gleason Score of 3+4, and PSA of 23.9. We discussed the patient's workup and outlined the nature of prostate cancer in this setting. The patient's T stage, Gleason's score, and PSA put him into the unfavorable intermediate-high risk group. Accordingly, he is eligible for a variety of potential treatment options including prostatectomy or ADT in combination with 5.5-8 weeks of external radiation. We discussed the available radiation techniques, and focused on the details and logistics of delivery. The patient is not an ideal candidate for brachytherapy with a prostate volume of 86.9 cc. We discussed and outlined the risks, benefits, short and long-term effects associated with radiotherapy and compared and contrasted these with prostatectomy. We discussed the role of SpaceOAR gel in reducing the rectal toxicity associated with radiotherapy. He appears to have a good understanding of his disease and our treatment recommendations which are of curative intent.  He was encouraged to ask questions that were answered to his stated  satisfaction.  At the conclusion of our conversation, the patient is interested in moving forward with 5.5-8 weeks of external beam radiation concurrent with ADT. However, he prefers to have the daily radiation treatments closer to his home in [place], so we have made a referral to Dr. Noreene Filbert at the [facility]. We enjoyed meeting him today and look forward to following his progress via correspondence.  We personally spent 70 minutes in this encounter including chart review, reviewing radiological studies, meeting face-to-face with  the patient, entering orders and completing documentation.    Nicholos Johns, PA-C    Tyler Pita, MD  [facility] Direct Dial: [phone]  Fax: [phone] [email or web address]  Skype  LinkedIn   This document serves as a record of services personally performed by Tyler Pita, MD and Freeman Caldron, PA-C. It was created on their behalf by Wilburn Mylar, a trained medical scribe. The creation of this record is based on the scribe's personal observations and the provider's statements to them. This document has been checked and approved by the attending provider.

## 2021-12-17 ENCOUNTER — Ambulatory Visit
Admission: RE | Admit: 2021-12-17 | Discharge: 2021-12-17 | Disposition: A | Discharge status: Home / Self Care | Payer: Medicare Other | Source: Ambulatory Visit | Attending: Radiation Oncology | Admitting: Radiation Oncology

## 2021-12-17 ENCOUNTER — Other Ambulatory Visit: Payer: Self-pay

## 2021-12-17 VITALS — BP 150/77 | HR 75 | Temp 98.1°F | Resp 20 | Ht 75.0 in | Wt 191.6 lb

## 2021-12-17 DIAGNOSIS — C61 Malignant neoplasm of prostate: Secondary | ICD-10-CM | POA: Diagnosis present

## 2021-12-17 DIAGNOSIS — F17210 Nicotine dependence, cigarettes, uncomplicated: Secondary | ICD-10-CM | POA: Insufficient documentation

## 2021-12-17 DIAGNOSIS — I10 Essential (primary) hypertension: Secondary | ICD-10-CM | POA: Diagnosis not present

## 2021-12-17 DIAGNOSIS — Z79899 Other long term (current) drug therapy: Secondary | ICD-10-CM | POA: Insufficient documentation

## 2021-12-17 DIAGNOSIS — J449 Chronic obstructive pulmonary disease, unspecified: Secondary | ICD-10-CM | POA: Diagnosis not present

## 2021-12-17 DIAGNOSIS — M129 Arthropathy, unspecified: Secondary | ICD-10-CM | POA: Diagnosis not present

## 2021-12-17 NOTE — Progress Notes (Signed)
Introduced myself to the patient as the prostate nurse navigator.  During assessment was able to identify transportation as possible barrier and verbalized he would be interested in speaking with social workers to learn about additional resources.  He is here to discuss his radiation treatment options.  I gave him my business card and asked him to call me with questions or concerns.  Verbalized understanding.

## 2021-12-18 ENCOUNTER — Inpatient Hospital Stay: Payer: Medicare Other | Attending: Radiation Oncology | Admitting: Licensed Clinical Social Worker

## 2021-12-18 NOTE — Progress Notes (Signed)
[place] Work  Initial Assessment   Jimmy Wilson is a 71 y.o. year old male contacted by phone. Clinical Social Work was referred by nurse navigator for assessment of psychosocial needs.   SDOH (Social Determinants of Health) assessments performed: Yes SDOH Interventions    Flowsheet Row Clinical Support from 12/18/2021 in [place] Interventions   Food Insecurity Interventions Intervention Not Indicated  Housing Interventions Intervention Not Indicated  Transportation Interventions Intervention Not Indicated  Utilities Interventions Intervention Not Indicated       SDOH Screenings   Food Insecurity: No Food Insecurity (12/18/2021)  Housing: Low Risk  (12/18/2021)  Transportation Needs: No Transportation Needs (12/18/2021)  Utilities: Not At Risk (12/18/2021)  Depression (PHQ2-9): Low Risk  (02/21/2019)  Financial Resource Strain: Low Risk  (12/18/2021)  Tobacco Use: High Risk (12/03/2021)     Distress Screen completed: No    12/17/2021    9:46 AM  ONCBCN DISTRESS SCREENING  Screening Type Initial Screening  Distress experienced in past week (1-10) 7  Practical problem type Insurance;Transportation  Emotional problem type Adjusting to illness  Information Concerns Type Lack of info about complementary therapy choices  Physical Problem type Pain;Getting around      Family/Social Information:  Housing Arrangement: patient lives alone Family members/support persons in your life? Family.  He has a sisters in [place], [place], [place].  He said he has one friend in [place].  He has lived in his home for eighteen years. Transportation concerns: no.  He has a truck and drives himself.  He was concerned about driving to [place] from [place] for radiation. Employment: Retired.  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  Income source: Paediatric nurse concerns: Yes, due to illness and/or loss of work during  treatment Type of concern: Medical bills Food access concerns: no Religious or spiritual practice: No.  Patient stated he was raised Catholic. Services Currently in place:  Medicare  Coping/ Adjustment to diagnosis: Patient understands treatment plan and what happens next? yes Concerns about diagnosis and/or treatment: How I will pay for the services I need Patient reported stressors: Finances Hopes and/or priorities: He hopes to move to [facility]. Patient enjoys watching TV Current coping skills/ strengths: Capable of independent living , Communication skills , General fund of knowledge , and Motivation for treatment/growth     SUMMARY: Current SDOH Barriers:  Medication procurement  Clinical Social Work Clinical Goal(s):  Explore community resource options for unmet needs related to:  Financial Strain   Interventions: Discussed common feeling and emotions when being diagnosed with cancer, and the importance of support during treatment Informed patient of the support team roles and support services at [facility] Provided [place] contact information and encouraged patient to call with any questions or concerns Provided patient with information about [facility] once treatment begins.   Also contacted Estelle Grumbles, Pharmacist, to determine if there are any grants for his medication.  He stated he has pain in his legs and has an appointment at [facility], a senior wellness clinic, on 12/26/21, to address.   Follow Up Plan: CSW will follow-up with patient by phone   He stated he will be receiving treatment in [address].  CSW to refer patient to [employer], LCSW, at [facility].  Patient verbalizes understanding of plan: Yes    Rodman Pickle Brison Fiumara, LCSW

## 2021-12-19 NOTE — Progress Notes (Signed)
Pt with recent RadOnc consult and voiced he would prefer to have radiation treatment closer to home.   Patient is scheduled for Rad Onc Consult with Dr. Baruch Gouty on 10/23.   Social worker sent additional referral to LCSW @ [place] to address previous barriers identified.

## 2021-12-30 ENCOUNTER — Telehealth: Payer: Self-pay

## 2021-12-30 ENCOUNTER — Encounter: Payer: Self-pay | Admitting: Radiation Oncology

## 2021-12-30 ENCOUNTER — Ambulatory Visit
Admission: RE | Admit: 2021-12-30 | Discharge: 2021-12-30 | Disposition: A | Discharge status: Home / Self Care | Payer: Medicare Other | Source: Ambulatory Visit | Attending: Radiation Oncology | Admitting: Radiation Oncology

## 2021-12-30 VITALS — BP 151/81 | HR 72 | Temp 97.4°F | Resp 18 | Ht 75.0 in | Wt 195.0 lb

## 2021-12-30 DIAGNOSIS — F17210 Nicotine dependence, cigarettes, uncomplicated: Secondary | ICD-10-CM | POA: Insufficient documentation

## 2021-12-30 DIAGNOSIS — B182 Chronic viral hepatitis C: Secondary | ICD-10-CM | POA: Diagnosis not present

## 2021-12-30 DIAGNOSIS — C61 Malignant neoplasm of prostate: Secondary | ICD-10-CM | POA: Insufficient documentation

## 2021-12-30 DIAGNOSIS — M129 Arthropathy, unspecified: Secondary | ICD-10-CM | POA: Insufficient documentation

## 2021-12-30 DIAGNOSIS — J449 Chronic obstructive pulmonary disease, unspecified: Secondary | ICD-10-CM | POA: Insufficient documentation

## 2021-12-30 DIAGNOSIS — Z79899 Other long term (current) drug therapy: Secondary | ICD-10-CM | POA: Diagnosis not present

## 2021-12-30 DIAGNOSIS — I10 Essential (primary) hypertension: Secondary | ICD-10-CM | POA: Diagnosis not present

## 2021-12-30 DIAGNOSIS — N529 Male erectile dysfunction, unspecified: Secondary | ICD-10-CM | POA: Diagnosis not present

## 2021-12-30 NOTE — Telephone Encounter (Signed)
Eligard pre-cert not required   Reference #[id number]  [id number]-start date 12/30/2021-12/31/2022 Approved   Auth #[id number]

## 2021-12-30 NOTE — Consult Note (Signed)
NEW PATIENT EVALUATION  Name: Jimmy Wilson  MRN: [id number]  Date:   12/30/2021     DOB: 1950-03-11   This 71 y.o. male patient presents to the clinic for initial evaluation of stage IIIa (cT1 cN0 M0) Gleason 7 (3+4) adenocarcinoma the prostate presenting with a PSA over 20.  REFERRING PHYSICIAN: Arthur Holms, NP  CHIEF COMPLAINT:  Chief Complaint  Patient presents with   Prostate Cancer    DIAGNOSIS: The encounter diagnosis was Malignant neoplasm of prostate ([place]).   PREVIOUS INVESTIGATIONS:  PET scan reviewed. Clinical notes reviewed Pathology reports reviewed  HPI: Patient is a 70 year old male who presented with an elevated PSA most recently 23.9.  He had a normal rectal exam although his prostate volume was 87 cc.  PSMA PET scan was performed showing asymmetric radiotracer accumulation in the prostate peripheral zones right greater than left consistent with primary prostate cancer no evidence of local or distant metastatic disease.  Patient underwent transrectal ultrasound-guided biopsy showing 11 of 12 cores positive for mixture of Gleason 7 (3+4) and Gleason 6 (3+3).  Patient has some erectile dysfunction no other significant lower urinary tract symptoms.  He is having no bone pain.  He is now referred to ration collagen for opinion.  PLANNED TREATMENT REGIMEN: Image guided IMRT radiation therapy to prostate and pelvic nodes  PAST MEDICAL HISTORY:  has a past medical history of Arthritis (10/22/2021), BACK PAIN (10/31/2008), Chronic hepatitis C without hepatic coma ([place]) (02/21/2019), Cirrhosis ([place]) (02/21/2019), COPD (chronic obstructive pulmonary disease) ([place]), HYPERTENSION (10/31/2008), and PSA, INCREASED (03/19/2009).    PAST SURGICAL HISTORY:  Past Surgical History:  Procedure Laterality Date   NECK SURGERY      FAMILY HISTORY: family history is not on file.  SOCIAL HISTORY:  reports that he has been smoking cigarettes. He has been smoking an average of 1.5 packs  per day. He has never used smokeless tobacco. He reports current alcohol use of about 2.0 - 3.0 standard drinks of alcohol per week. He reports that he does not use drugs.  ALLERGIES: Aspirin  MEDICATIONS:  Current Outpatient Medications  Medication Sig Dispense Refill   lisinopril-hydrochlorothiazide (ZESTORETIC) 10-12.5 MG tablet Take 1 tablet by mouth daily.     rosuvastatin (CRESTOR) 5 MG tablet SMARTSIG:1 Tablet(s) By Mouth Every Evening     No current facility-administered medications for this encounter.    ECOG PERFORMANCE STATUS:  0 - Asymptomatic  REVIEW OF SYSTEMS: Patient denies any weight loss, fatigue, weakness, fever, chills or night sweats. Patient denies any loss of vision, blurred vision. Patient denies any ringing  of the ears or hearing loss. No irregular heartbeat. Patient denies heart murmur or history of fainting. Patient denies any chest pain or pain radiating to her upper extremities. Patient denies any shortness of breath, difficulty breathing at night, cough or hemoptysis. Patient denies any swelling in the lower legs. Patient denies any nausea vomiting, vomiting of blood, or coffee ground material in the vomitus. Patient denies any stomach pain. Patient states has had normal bowel movements no significant constipation or diarrhea. Patient denies any dysuria, hematuria or significant nocturia. Patient denies any problems walking, swelling in the joints or loss of balance. Patient denies any skin changes, loss of hair or loss of weight. Patient denies any excessive worrying or anxiety or significant depression. Patient denies any problems with insomnia. Patient denies excessive thirst, polyuria, polydipsia. Patient denies any swollen glands, patient denies easy bruising or easy bleeding. Patient denies any recent infections, allergies or  URI. Patient "s visual fields have not changed significantly in recent time.   PHYSICAL EXAM: BP (!) 151/81 (BP Location: Left Arm,  Patient Position: Sitting, Cuff Size: Normal)   Pulse 72   Temp (!) 97.4 F (36.3 C) (Tympanic)   Resp 18   Ht '6\' 3"'$  (1.905 m)   Wt 195 lb (88.5 kg)   BMI 24.37 kg/m  Well-developed well-nourished patient in NAD. HEENT reveals PERLA, EOMI, discs not visualized.  Oral cavity is clear. No oral mucosal lesions are identified. Neck is clear without evidence of cervical or supraclavicular adenopathy. Lungs are clear to A&P. Cardiac examination is essentially unremarkable with regular rate and rhythm without murmur rub or thrill. Abdomen is benign with no organomegaly or masses noted. Motor sensory and DTR levels are equal and symmetric in the upper and lower extremities. Cranial nerves II through XII are grossly intact. Proprioception is intact. No peripheral adenopathy or edema is identified. No motor or sensory levels are noted. Crude visual fields are within normal range.  LABORATORY DATA: Pathology reports reviewed    RADIOLOGY RESULTS: PSMA PET scan reviewed compatible with above-stated findings   IMPRESSION: Clinical stage T1 cN0 M0) adenocarcinoma the prostate presenting with a Gleason in the 20 range in 71 year old male  PLAN: This time I run the [facility] nomogram showing a22% of lymph node involvement as well as 85% chance of extracapsular extension.  Based on the nomogram like to treat his prostate and pelvic nodes.  We treat the prostate 80 Gray over 8 weeks and pelvic lymph nodes to 72 Gray over 8 weeks using IMRT dose painting technique.  Risks and benefits of treatment including increased lower Neri tract symptoms possible diarrhea fatigue alteration of blood counts and skin reaction all were reviewed with the patient.  I have asked Dr. Erlene Quan to place fiducial markers in the prostate for daily image guided treatment.  We will also start him on Eligard and probably keep him suppressed for at least 2 years.  Patient comprehends my recommendations well we will set up  simulation when his markers have been placed.  I would like to take this opportunity to thank you for allowing me to participate in the care of your patient.Noreene Filbert, MD

## 2021-12-30 NOTE — Telephone Encounter (Signed)
Left message for patient to call and schedule seed marker.  Eligard has been requested and approved by insurance.

## 2021-12-31 ENCOUNTER — Telehealth: Payer: Self-pay | Admitting: *Deleted

## 2021-12-31 NOTE — Telephone Encounter (Signed)
Patient notified of CT/Sim  appointment 01/06/2022 11:00 AM.

## 2022-01-01 ENCOUNTER — Encounter: Payer: Self-pay | Admitting: Urology

## 2022-01-01 ENCOUNTER — Ambulatory Visit: Payer: Medicare Other | Admitting: Urology

## 2022-01-01 VITALS — BP 161/79 | HR 94 | Ht 75.0 in | Wt 194.2 lb

## 2022-01-01 DIAGNOSIS — C61 Malignant neoplasm of prostate: Secondary | ICD-10-CM | POA: Diagnosis not present

## 2022-01-01 MED ORDER — LEUPROLIDE ACETATE (6 MONTH) 45 MG ~~LOC~~ KIT
45.0000 mg | PACK | Freq: Once | SUBCUTANEOUS | Status: AC
  Administered 2022-01-01: 45 mg via SUBCUTANEOUS

## 2022-01-01 MED ORDER — LEVOFLOXACIN 500 MG PO TABS
500.0000 mg | ORAL_TABLET | Freq: Once | ORAL | Status: AC
  Administered 2022-01-01: 500 mg via ORAL

## 2022-01-01 MED ORDER — GENTAMICIN SULFATE 40 MG/ML IJ SOLN
80.0000 mg | Freq: Once | INTRAMUSCULAR | Status: AC
  Administered 2022-01-01: 80 mg via INTRAMUSCULAR

## 2022-01-01 NOTE — Progress Notes (Signed)
01/01/22  CC: gold seed markers  HPI: 71 y.o. male with prostate cancer who presents today for placement of fiducial seed markers in anticipation of his upcoming IMRT with Dr. Baruch Gouty.  He also received his first dose of Eligard today.  Reviewed bone strengthening calcium and vitamin D supplementation as well as physical activity weightbearing exercise.  Prostate Gold Seed Marker Placement Procedure   Informed consent was obtained after discussing risks/benefits of the procedure.  A time out was performed to ensure correct patient identity.  Pre-Procedure: - Gentamicin given prophylactically - PO Levaquin 500 mg also given today  Procedure: -Lidocaine jelly was administered per rectum -Rectal ultrasound probe was placed without difficulty and the prostate visualized - 3 fiducial gold seed markers placed, one at right base, one at left base, one at apex of prostate gland under transrectal ultrasound guidance  Post-Procedure: - Patient tolerated the procedure well - He was counseled to seek immediate medical attention if experiences any severe pain, significant bleeding, or fevers  Return in about 6 months (around 07/03/2022) for PSA/ Eligard.   Hollice Espy, MD

## 2022-01-06 ENCOUNTER — Ambulatory Visit
Admission: RE | Admit: 2022-01-06 | Discharge: 2022-01-06 | Disposition: A | Discharge status: Home / Self Care | Payer: Medicare Other | Source: Ambulatory Visit | Attending: Radiation Oncology | Admitting: Radiation Oncology

## 2022-01-06 DIAGNOSIS — C61 Malignant neoplasm of prostate: Secondary | ICD-10-CM | POA: Diagnosis present

## 2022-01-07 DIAGNOSIS — C61 Malignant neoplasm of prostate: Secondary | ICD-10-CM | POA: Diagnosis not present

## 2022-01-10 ENCOUNTER — Other Ambulatory Visit: Payer: Self-pay | Admitting: *Deleted

## 2022-01-10 DIAGNOSIS — C61 Malignant neoplasm of prostate: Secondary | ICD-10-CM

## 2022-01-13 ENCOUNTER — Inpatient Hospital Stay: Payer: Medicare Other | Admitting: Licensed Clinical Social Worker

## 2022-01-13 DIAGNOSIS — C61 Malignant neoplasm of prostate: Secondary | ICD-10-CM

## 2022-01-13 NOTE — Progress Notes (Signed)
Signed      [place] Directives Clinical Social Work   Clinical Social Work was referred by Palliative Care NP  to review and complete healthcare advance directives.  Clinical Social Worker met with patient and N/A in [place] office.  The patient designated Pincus Large as their primary healthcare agent and Earl Many as their secondary agent.  Patient also completed healthcare living will.     Clinical Social Worker notarized documents and made copies for patient/family. Clinical Social Worker will send documents to medical records to be scanned into patient's chart. Clinical Social Worker encouraged patient/family to contact with any additional questions or concerns.   Adelene Amas, LCSW, MSW, MA Clinical Social Worker [facility] at [facility] [phone]

## 2022-01-13 NOTE — Progress Notes (Signed)
[place] [facility]  Clinical Social Work was referred by Palliative Care NP  to review and complete healthcare advance directives.  Clinical Social Worker met with patient and N/A in [place] office.  The patient designated Pincus Large as their primary healthcare agent and Earl Many as their secondary agent.  Patient also completed healthcare living will.    Clinical Social Worker notarized documents and made copies for patient/family. Clinical Social Worker will send documents to medical records to be scanned into patient's chart. Clinical Social Worker encouraged patient/family to contact with any additional questions or concerns.  Adelene Amas, LCSW, MSW, MA Clinical Social Worker [facility] at [facility] [phone]

## 2022-01-14 ENCOUNTER — Ambulatory Visit: Payer: Medicare Other

## 2022-01-15 ENCOUNTER — Ambulatory Visit: Payer: Medicare Other

## 2022-01-16 ENCOUNTER — Ambulatory Visit: Payer: Medicare Other

## 2022-01-17 ENCOUNTER — Ambulatory Visit: Payer: Medicare Other

## 2022-01-20 ENCOUNTER — Ambulatory Visit: Payer: Medicare Other

## 2022-01-21 ENCOUNTER — Ambulatory Visit: Payer: Medicare Other

## 2022-01-22 ENCOUNTER — Ambulatory Visit
Admission: RE | Admit: 2022-01-22 | Payer: Medicare Other | Source: Ambulatory Visit | Attending: Radiation Oncology | Admitting: Radiation Oncology

## 2022-01-22 ENCOUNTER — Ambulatory Visit: Payer: Medicare Other

## 2022-01-22 ENCOUNTER — Inpatient Hospital Stay: Payer: Medicare Other

## 2022-01-23 ENCOUNTER — Ambulatory Visit
Admission: RE | Admit: 2022-01-23 | Discharge: 2022-01-23 | Disposition: A | Discharge status: Home / Self Care | Payer: Medicare Other | Source: Ambulatory Visit | Attending: Radiation Oncology | Admitting: Radiation Oncology

## 2022-01-23 ENCOUNTER — Other Ambulatory Visit: Payer: Self-pay

## 2022-01-23 DIAGNOSIS — C61 Malignant neoplasm of prostate: Secondary | ICD-10-CM | POA: Insufficient documentation

## 2022-01-23 LAB — RAD ONC ARIA SESSION SUMMARY
Course Elapsed Days: 0
Plan Fractions Treated to Date: 1
Plan Prescribed Dose Per Fraction: 2 Gy
Plan Total Fractions Prescribed: 40
Plan Total Prescribed Dose: 80 Gy
Reference Point Dosage Given to Date: 2 Gy
Reference Point Session Dosage Given: 2 Gy
Session Number: 1

## 2022-01-24 ENCOUNTER — Other Ambulatory Visit: Payer: Self-pay

## 2022-01-24 ENCOUNTER — Ambulatory Visit
Admission: RE | Admit: 2022-01-24 | Discharge: 2022-01-24 | Disposition: A | Discharge status: Home / Self Care | Payer: Medicare Other | Source: Ambulatory Visit

## 2022-01-24 DIAGNOSIS — C61 Malignant neoplasm of prostate: Secondary | ICD-10-CM | POA: Diagnosis not present

## 2022-01-24 LAB — RAD ONC ARIA SESSION SUMMARY
Course Elapsed Days: 1
Plan Fractions Treated to Date: 2
Plan Prescribed Dose Per Fraction: 2 Gy
Plan Total Fractions Prescribed: 40
Plan Total Prescribed Dose: 80 Gy
Reference Point Dosage Given to Date: 4 Gy
Reference Point Session Dosage Given: 2 Gy
Session Number: 2

## 2022-01-27 ENCOUNTER — Other Ambulatory Visit: Payer: Self-pay | Admitting: *Deleted

## 2022-01-27 ENCOUNTER — Ambulatory Visit
Admission: RE | Admit: 2022-01-27 | Discharge: 2022-01-27 | Disposition: A | Discharge status: Home / Self Care | Payer: Medicare Other | Source: Ambulatory Visit | Attending: Radiation Oncology | Admitting: Radiation Oncology

## 2022-01-27 ENCOUNTER — Other Ambulatory Visit: Payer: Self-pay

## 2022-01-27 DIAGNOSIS — C61 Malignant neoplasm of prostate: Secondary | ICD-10-CM | POA: Diagnosis not present

## 2022-01-27 LAB — RAD ONC ARIA SESSION SUMMARY
Course Elapsed Days: 4
Plan Fractions Treated to Date: 3
Plan Prescribed Dose Per Fraction: 2 Gy
Plan Total Fractions Prescribed: 40
Plan Total Prescribed Dose: 80 Gy
Reference Point Dosage Given to Date: 6 Gy
Reference Point Session Dosage Given: 2 Gy
Session Number: 3

## 2022-01-27 MED ORDER — TAMSULOSIN HCL 0.4 MG PO CAPS: 0.4000 mg | ORAL_CAPSULE | Freq: Every day | ORAL | 3 refills | Status: DC

## 2022-01-28 ENCOUNTER — Other Ambulatory Visit: Payer: Self-pay

## 2022-01-28 ENCOUNTER — Ambulatory Visit
Admission: RE | Admit: 2022-01-28 | Discharge: 2022-01-28 | Disposition: A | Discharge status: Home / Self Care | Payer: Medicare Other | Source: Ambulatory Visit | Attending: Radiation Oncology | Admitting: Radiation Oncology

## 2022-01-28 DIAGNOSIS — C61 Malignant neoplasm of prostate: Secondary | ICD-10-CM | POA: Diagnosis not present

## 2022-01-28 LAB — RAD ONC ARIA SESSION SUMMARY
Course Elapsed Days: 5
Plan Fractions Treated to Date: 4
Plan Prescribed Dose Per Fraction: 2 Gy
Plan Total Fractions Prescribed: 40
Plan Total Prescribed Dose: 80 Gy
Reference Point Dosage Given to Date: 8 Gy
Reference Point Session Dosage Given: 2 Gy
Session Number: 4

## 2022-01-29 ENCOUNTER — Ambulatory Visit
Admission: RE | Admit: 2022-01-29 | Discharge: 2022-01-29 | Disposition: A | Discharge status: Home / Self Care | Payer: Medicare Other | Source: Ambulatory Visit

## 2022-01-29 ENCOUNTER — Other Ambulatory Visit: Payer: Self-pay

## 2022-01-29 DIAGNOSIS — C61 Malignant neoplasm of prostate: Secondary | ICD-10-CM | POA: Diagnosis not present

## 2022-01-29 LAB — RAD ONC ARIA SESSION SUMMARY
Course Elapsed Days: 6
Plan Fractions Treated to Date: 5
Plan Prescribed Dose Per Fraction: 2 Gy
Plan Total Fractions Prescribed: 40
Plan Total Prescribed Dose: 80 Gy
Reference Point Dosage Given to Date: 10 Gy
Reference Point Session Dosage Given: 2 Gy
Session Number: 5

## 2022-02-03 ENCOUNTER — Ambulatory Visit: Payer: Medicare Other

## 2022-02-04 ENCOUNTER — Other Ambulatory Visit: Payer: Self-pay

## 2022-02-04 ENCOUNTER — Ambulatory Visit
Admission: RE | Admit: 2022-02-04 | Discharge: 2022-02-04 | Disposition: A | Discharge status: Home / Self Care | Payer: Medicare Other | Source: Ambulatory Visit | Attending: Radiation Oncology | Admitting: Radiation Oncology

## 2022-02-04 DIAGNOSIS — C61 Malignant neoplasm of prostate: Secondary | ICD-10-CM | POA: Diagnosis not present

## 2022-02-04 LAB — RAD ONC ARIA SESSION SUMMARY
Course Elapsed Days: 12
Plan Fractions Treated to Date: 6
Plan Prescribed Dose Per Fraction: 2 Gy
Plan Total Fractions Prescribed: 40
Plan Total Prescribed Dose: 80 Gy
Reference Point Dosage Given to Date: 12 Gy
Reference Point Session Dosage Given: 2 Gy
Session Number: 6

## 2022-02-05 ENCOUNTER — Other Ambulatory Visit: Payer: Self-pay

## 2022-02-05 ENCOUNTER — Inpatient Hospital Stay: Payer: Medicare Other

## 2022-02-05 ENCOUNTER — Ambulatory Visit
Admission: RE | Admit: 2022-02-05 | Discharge: 2022-02-05 | Disposition: A | Discharge status: Home / Self Care | Payer: Medicare Other | Source: Ambulatory Visit | Attending: Radiation Oncology | Admitting: Radiation Oncology

## 2022-02-05 DIAGNOSIS — C61 Malignant neoplasm of prostate: Secondary | ICD-10-CM | POA: Diagnosis not present

## 2022-02-05 LAB — RAD ONC ARIA SESSION SUMMARY
Course Elapsed Days: 13
Plan Fractions Treated to Date: 7
Plan Prescribed Dose Per Fraction: 2 Gy
Plan Total Fractions Prescribed: 40
Plan Total Prescribed Dose: 80 Gy
Reference Point Dosage Given to Date: 14 Gy
Reference Point Session Dosage Given: 2 Gy
Session Number: 7

## 2022-02-05 LAB — CBC
HCT: 38.8 % — ABNORMAL LOW (ref 39.0–52.0)
Hemoglobin: 13.5 g/dL (ref 13.0–17.0)
MCH: 32.6 pg (ref 26.0–34.0)
MCHC: 34.8 g/dL (ref 30.0–36.0)
MCV: 93.7 fL (ref 80.0–100.0)
Platelets: 237 10*3/uL (ref 150–400)
RBC: 4.14 MIL/uL — ABNORMAL LOW (ref 4.22–5.81)
RDW: 11.7 % (ref 11.5–15.5)
WBC: 7.7 10*3/uL (ref 4.0–10.5)
nRBC: 0 % (ref 0.0–0.2)

## 2022-02-06 ENCOUNTER — Other Ambulatory Visit: Payer: Self-pay

## 2022-02-06 ENCOUNTER — Ambulatory Visit
Admission: RE | Admit: 2022-02-06 | Discharge: 2022-02-06 | Disposition: A | Discharge status: Home / Self Care | Payer: Medicare Other | Source: Ambulatory Visit | Attending: Radiation Oncology | Admitting: Radiation Oncology

## 2022-02-06 DIAGNOSIS — C61 Malignant neoplasm of prostate: Secondary | ICD-10-CM | POA: Diagnosis not present

## 2022-02-06 LAB — RAD ONC ARIA SESSION SUMMARY
Course Elapsed Days: 14
Plan Fractions Treated to Date: 8
Plan Prescribed Dose Per Fraction: 2 Gy
Plan Total Fractions Prescribed: 40
Plan Total Prescribed Dose: 80 Gy
Reference Point Dosage Given to Date: 16 Gy
Reference Point Session Dosage Given: 2 Gy
Session Number: 8

## 2022-02-07 ENCOUNTER — Ambulatory Visit
Admission: RE | Admit: 2022-02-07 | Discharge: 2022-02-07 | Disposition: A | Discharge status: Home / Self Care | Payer: Medicare Other | Source: Ambulatory Visit | Attending: Radiation Oncology | Admitting: Radiation Oncology

## 2022-02-07 ENCOUNTER — Other Ambulatory Visit: Payer: Self-pay

## 2022-02-07 DIAGNOSIS — C61 Malignant neoplasm of prostate: Secondary | ICD-10-CM | POA: Insufficient documentation

## 2022-02-07 LAB — RAD ONC ARIA SESSION SUMMARY
Course Elapsed Days: 15
Plan Fractions Treated to Date: 9
Plan Prescribed Dose Per Fraction: 2 Gy
Plan Total Fractions Prescribed: 40
Plan Total Prescribed Dose: 80 Gy
Reference Point Dosage Given to Date: 18 Gy
Reference Point Session Dosage Given: 2 Gy
Session Number: 9

## 2022-02-10 ENCOUNTER — Other Ambulatory Visit: Payer: Self-pay

## 2022-02-10 ENCOUNTER — Ambulatory Visit
Admission: RE | Admit: 2022-02-10 | Discharge: 2022-02-10 | Disposition: A | Discharge status: Home / Self Care | Payer: Medicare Other | Source: Ambulatory Visit | Attending: Radiation Oncology | Admitting: Radiation Oncology

## 2022-02-10 DIAGNOSIS — C61 Malignant neoplasm of prostate: Secondary | ICD-10-CM | POA: Diagnosis not present

## 2022-02-10 LAB — RAD ONC ARIA SESSION SUMMARY
Course Elapsed Days: 18
Plan Fractions Treated to Date: 10
Plan Prescribed Dose Per Fraction: 2 Gy
Plan Total Fractions Prescribed: 40
Plan Total Prescribed Dose: 80 Gy
Reference Point Dosage Given to Date: 20 Gy
Reference Point Session Dosage Given: 2 Gy
Session Number: 10

## 2022-02-11 ENCOUNTER — Other Ambulatory Visit: Payer: Self-pay

## 2022-02-11 ENCOUNTER — Ambulatory Visit
Admission: RE | Admit: 2022-02-11 | Discharge: 2022-02-11 | Disposition: A | Discharge status: Home / Self Care | Payer: Medicare Other | Source: Ambulatory Visit | Attending: Radiation Oncology | Admitting: Radiation Oncology

## 2022-02-11 DIAGNOSIS — C61 Malignant neoplasm of prostate: Secondary | ICD-10-CM | POA: Diagnosis not present

## 2022-02-11 LAB — RAD ONC ARIA SESSION SUMMARY
Course Elapsed Days: 19
Plan Fractions Treated to Date: 11
Plan Prescribed Dose Per Fraction: 2 Gy
Plan Total Fractions Prescribed: 40
Plan Total Prescribed Dose: 80 Gy
Reference Point Dosage Given to Date: 22 Gy
Reference Point Session Dosage Given: 2 Gy
Session Number: 11

## 2022-02-12 ENCOUNTER — Other Ambulatory Visit: Payer: Self-pay

## 2022-02-12 ENCOUNTER — Ambulatory Visit
Admission: RE | Admit: 2022-02-12 | Discharge: 2022-02-12 | Disposition: A | Discharge status: Home / Self Care | Payer: Medicare Other | Source: Ambulatory Visit | Attending: Radiation Oncology | Admitting: Radiation Oncology

## 2022-02-12 DIAGNOSIS — C61 Malignant neoplasm of prostate: Secondary | ICD-10-CM | POA: Diagnosis not present

## 2022-02-12 LAB — RAD ONC ARIA SESSION SUMMARY
Course Elapsed Days: 20
Plan Fractions Treated to Date: 12
Plan Prescribed Dose Per Fraction: 2 Gy
Plan Total Fractions Prescribed: 40
Plan Total Prescribed Dose: 80 Gy
Reference Point Dosage Given to Date: 24 Gy
Reference Point Session Dosage Given: 2 Gy
Session Number: 12

## 2022-02-13 ENCOUNTER — Other Ambulatory Visit: Payer: Self-pay

## 2022-02-13 ENCOUNTER — Ambulatory Visit
Admission: RE | Admit: 2022-02-13 | Discharge: 2022-02-13 | Disposition: A | Discharge status: Home / Self Care | Payer: Medicare Other | Source: Ambulatory Visit | Attending: Radiation Oncology | Admitting: Radiation Oncology

## 2022-02-13 DIAGNOSIS — C61 Malignant neoplasm of prostate: Secondary | ICD-10-CM | POA: Diagnosis not present

## 2022-02-13 LAB — RAD ONC ARIA SESSION SUMMARY
Course Elapsed Days: 21
Plan Fractions Treated to Date: 13
Plan Prescribed Dose Per Fraction: 2 Gy
Plan Total Fractions Prescribed: 40
Plan Total Prescribed Dose: 80 Gy
Reference Point Dosage Given to Date: 26 Gy
Reference Point Session Dosage Given: 2 Gy
Session Number: 13

## 2022-02-14 ENCOUNTER — Other Ambulatory Visit: Payer: Self-pay

## 2022-02-14 ENCOUNTER — Ambulatory Visit
Admission: RE | Admit: 2022-02-14 | Discharge: 2022-02-14 | Disposition: A | Discharge status: Home / Self Care | Payer: Medicare Other | Source: Ambulatory Visit | Attending: Radiation Oncology | Admitting: Radiation Oncology

## 2022-02-14 DIAGNOSIS — C61 Malignant neoplasm of prostate: Secondary | ICD-10-CM | POA: Diagnosis not present

## 2022-02-14 LAB — RAD ONC ARIA SESSION SUMMARY
Course Elapsed Days: 22
Plan Fractions Treated to Date: 14
Plan Prescribed Dose Per Fraction: 2 Gy
Plan Total Fractions Prescribed: 40
Plan Total Prescribed Dose: 80 Gy
Reference Point Dosage Given to Date: 28 Gy
Reference Point Session Dosage Given: 2 Gy
Session Number: 14

## 2022-02-17 ENCOUNTER — Other Ambulatory Visit: Payer: Self-pay

## 2022-02-17 ENCOUNTER — Ambulatory Visit
Admission: RE | Admit: 2022-02-17 | Discharge: 2022-02-17 | Disposition: A | Discharge status: Home / Self Care | Payer: Medicare Other | Source: Ambulatory Visit | Attending: Radiation Oncology | Admitting: Radiation Oncology

## 2022-02-17 DIAGNOSIS — C61 Malignant neoplasm of prostate: Secondary | ICD-10-CM | POA: Diagnosis not present

## 2022-02-17 LAB — RAD ONC ARIA SESSION SUMMARY
Course Elapsed Days: 25
Plan Fractions Treated to Date: 15
Plan Prescribed Dose Per Fraction: 2 Gy
Plan Total Fractions Prescribed: 40
Plan Total Prescribed Dose: 80 Gy
Reference Point Dosage Given to Date: 30 Gy
Reference Point Session Dosage Given: 2 Gy
Session Number: 15

## 2022-02-18 ENCOUNTER — Other Ambulatory Visit: Payer: Self-pay

## 2022-02-18 ENCOUNTER — Ambulatory Visit
Admission: RE | Admit: 2022-02-18 | Discharge: 2022-02-18 | Disposition: A | Discharge status: Home / Self Care | Payer: Medicare Other | Source: Ambulatory Visit | Attending: Radiation Oncology | Admitting: Radiation Oncology

## 2022-02-18 DIAGNOSIS — C61 Malignant neoplasm of prostate: Secondary | ICD-10-CM | POA: Diagnosis not present

## 2022-02-18 LAB — RAD ONC ARIA SESSION SUMMARY
Course Elapsed Days: 26
Plan Fractions Treated to Date: 16
Plan Prescribed Dose Per Fraction: 2 Gy
Plan Total Fractions Prescribed: 40
Plan Total Prescribed Dose: 80 Gy
Reference Point Dosage Given to Date: 32 Gy
Reference Point Session Dosage Given: 2 Gy
Session Number: 16

## 2022-02-19 ENCOUNTER — Ambulatory Visit
Admission: RE | Admit: 2022-02-19 | Discharge: 2022-02-19 | Disposition: A | Discharge status: Home / Self Care | Payer: Medicare Other | Source: Ambulatory Visit | Attending: Radiation Oncology | Admitting: Radiation Oncology

## 2022-02-19 ENCOUNTER — Inpatient Hospital Stay: Payer: Medicare Other

## 2022-02-19 ENCOUNTER — Other Ambulatory Visit: Payer: Self-pay

## 2022-02-19 DIAGNOSIS — C61 Malignant neoplasm of prostate: Secondary | ICD-10-CM | POA: Insufficient documentation

## 2022-02-19 LAB — CBC
HCT: 38.6 % — ABNORMAL LOW (ref 39.0–52.0)
Hemoglobin: 13.3 g/dL (ref 13.0–17.0)
MCH: 32.4 pg (ref 26.0–34.0)
MCHC: 34.5 g/dL (ref 30.0–36.0)
MCV: 93.9 fL (ref 80.0–100.0)
Platelets: 244 10*3/uL (ref 150–400)
RBC: 4.11 MIL/uL — ABNORMAL LOW (ref 4.22–5.81)
RDW: 11.6 % (ref 11.5–15.5)
WBC: 9.4 10*3/uL (ref 4.0–10.5)
nRBC: 0 % (ref 0.0–0.2)

## 2022-02-19 LAB — RAD ONC ARIA SESSION SUMMARY
Course Elapsed Days: 27
Plan Fractions Treated to Date: 17
Plan Prescribed Dose Per Fraction: 2 Gy
Plan Total Fractions Prescribed: 40
Plan Total Prescribed Dose: 80 Gy
Reference Point Dosage Given to Date: 34 Gy
Reference Point Session Dosage Given: 2 Gy
Session Number: 17

## 2022-02-20 ENCOUNTER — Ambulatory Visit
Admission: RE | Admit: 2022-02-20 | Discharge: 2022-02-20 | Disposition: A | Discharge status: Home / Self Care | Payer: Medicare Other | Source: Ambulatory Visit | Attending: Radiation Oncology | Admitting: Radiation Oncology

## 2022-02-20 ENCOUNTER — Other Ambulatory Visit: Payer: Self-pay

## 2022-02-20 DIAGNOSIS — C61 Malignant neoplasm of prostate: Secondary | ICD-10-CM | POA: Diagnosis not present

## 2022-02-20 LAB — RAD ONC ARIA SESSION SUMMARY
Course Elapsed Days: 28
Plan Fractions Treated to Date: 18
Plan Prescribed Dose Per Fraction: 2 Gy
Plan Total Fractions Prescribed: 40
Plan Total Prescribed Dose: 80 Gy
Reference Point Dosage Given to Date: 36 Gy
Reference Point Session Dosage Given: 2 Gy
Session Number: 18

## 2022-02-21 ENCOUNTER — Other Ambulatory Visit: Payer: Self-pay

## 2022-02-21 ENCOUNTER — Ambulatory Visit
Admission: RE | Admit: 2022-02-21 | Discharge: 2022-02-21 | Disposition: A | Discharge status: Home / Self Care | Payer: Medicare Other | Source: Ambulatory Visit | Attending: Radiation Oncology | Admitting: Radiation Oncology

## 2022-02-21 DIAGNOSIS — C61 Malignant neoplasm of prostate: Secondary | ICD-10-CM | POA: Diagnosis not present

## 2022-02-21 LAB — RAD ONC ARIA SESSION SUMMARY
Course Elapsed Days: 29
Plan Fractions Treated to Date: 19
Plan Prescribed Dose Per Fraction: 2 Gy
Plan Total Fractions Prescribed: 40
Plan Total Prescribed Dose: 80 Gy
Reference Point Dosage Given to Date: 38 Gy
Reference Point Session Dosage Given: 2 Gy
Session Number: 19

## 2022-02-24 ENCOUNTER — Other Ambulatory Visit: Payer: Self-pay

## 2022-02-24 ENCOUNTER — Ambulatory Visit
Admission: RE | Admit: 2022-02-24 | Discharge: 2022-02-24 | Disposition: A | Discharge status: Home / Self Care | Payer: Medicare Other | Source: Ambulatory Visit | Attending: Radiation Oncology | Admitting: Radiation Oncology

## 2022-02-24 DIAGNOSIS — C61 Malignant neoplasm of prostate: Secondary | ICD-10-CM | POA: Diagnosis not present

## 2022-02-24 LAB — RAD ONC ARIA SESSION SUMMARY
Course Elapsed Days: 32
Plan Fractions Treated to Date: 20
Plan Prescribed Dose Per Fraction: 2 Gy
Plan Total Fractions Prescribed: 40
Plan Total Prescribed Dose: 80 Gy
Reference Point Dosage Given to Date: 40 Gy
Reference Point Session Dosage Given: 2 Gy
Session Number: 20

## 2022-02-25 ENCOUNTER — Other Ambulatory Visit: Payer: Self-pay

## 2022-02-25 ENCOUNTER — Ambulatory Visit
Admission: RE | Admit: 2022-02-25 | Discharge: 2022-02-25 | Disposition: A | Discharge status: Home / Self Care | Payer: Medicare Other | Source: Ambulatory Visit | Attending: Radiation Oncology | Admitting: Radiation Oncology

## 2022-02-25 DIAGNOSIS — C61 Malignant neoplasm of prostate: Secondary | ICD-10-CM | POA: Diagnosis not present

## 2022-02-25 LAB — RAD ONC ARIA SESSION SUMMARY
Course Elapsed Days: 33
Plan Fractions Treated to Date: 21
Plan Prescribed Dose Per Fraction: 2 Gy
Plan Total Fractions Prescribed: 40
Plan Total Prescribed Dose: 80 Gy
Reference Point Dosage Given to Date: 42 Gy
Reference Point Session Dosage Given: 2 Gy
Session Number: 21

## 2022-02-26 ENCOUNTER — Ambulatory Visit
Admission: RE | Admit: 2022-02-26 | Discharge: 2022-02-26 | Disposition: A | Discharge status: Home / Self Care | Payer: Medicare Other | Source: Ambulatory Visit | Attending: Radiation Oncology | Admitting: Radiation Oncology

## 2022-02-26 ENCOUNTER — Other Ambulatory Visit: Payer: Self-pay

## 2022-02-26 DIAGNOSIS — C61 Malignant neoplasm of prostate: Secondary | ICD-10-CM | POA: Diagnosis not present

## 2022-02-26 LAB — RAD ONC ARIA SESSION SUMMARY
Course Elapsed Days: 34
Plan Fractions Treated to Date: 22
Plan Prescribed Dose Per Fraction: 2 Gy
Plan Total Fractions Prescribed: 40
Plan Total Prescribed Dose: 80 Gy
Reference Point Dosage Given to Date: 44 Gy
Reference Point Session Dosage Given: 2 Gy
Session Number: 22

## 2022-02-27 ENCOUNTER — Other Ambulatory Visit: Payer: Self-pay

## 2022-02-27 ENCOUNTER — Ambulatory Visit
Admission: RE | Admit: 2022-02-27 | Discharge: 2022-02-27 | Disposition: A | Discharge status: Home / Self Care | Payer: Medicare Other | Source: Ambulatory Visit | Attending: Radiation Oncology | Admitting: Radiation Oncology

## 2022-02-27 DIAGNOSIS — C61 Malignant neoplasm of prostate: Secondary | ICD-10-CM | POA: Diagnosis not present

## 2022-02-27 LAB — RAD ONC ARIA SESSION SUMMARY
Course Elapsed Days: 35
Plan Fractions Treated to Date: 23
Plan Prescribed Dose Per Fraction: 2 Gy
Plan Total Fractions Prescribed: 40
Plan Total Prescribed Dose: 80 Gy
Reference Point Dosage Given to Date: 46 Gy
Reference Point Session Dosage Given: 2 Gy
Session Number: 23

## 2022-02-28 ENCOUNTER — Other Ambulatory Visit: Payer: Self-pay

## 2022-02-28 ENCOUNTER — Ambulatory Visit
Admission: RE | Admit: 2022-02-28 | Discharge: 2022-02-28 | Disposition: A | Discharge status: Home / Self Care | Payer: Medicare Other | Source: Ambulatory Visit | Attending: Radiation Oncology | Admitting: Radiation Oncology

## 2022-02-28 DIAGNOSIS — C61 Malignant neoplasm of prostate: Secondary | ICD-10-CM | POA: Diagnosis not present

## 2022-02-28 LAB — RAD ONC ARIA SESSION SUMMARY
Course Elapsed Days: 36
Plan Fractions Treated to Date: 24
Plan Prescribed Dose Per Fraction: 2 Gy
Plan Total Fractions Prescribed: 40
Plan Total Prescribed Dose: 80 Gy
Reference Point Dosage Given to Date: 48 Gy
Reference Point Session Dosage Given: 2 Gy
Session Number: 24

## 2022-03-04 ENCOUNTER — Other Ambulatory Visit: Payer: Self-pay

## 2022-03-04 ENCOUNTER — Ambulatory Visit
Admission: RE | Admit: 2022-03-04 | Discharge: 2022-03-04 | Disposition: A | Discharge status: Home / Self Care | Payer: Medicare Other | Source: Ambulatory Visit | Attending: Radiation Oncology | Admitting: Radiation Oncology

## 2022-03-04 DIAGNOSIS — C61 Malignant neoplasm of prostate: Secondary | ICD-10-CM | POA: Diagnosis not present

## 2022-03-04 LAB — RAD ONC ARIA SESSION SUMMARY
Course Elapsed Days: 40
Plan Fractions Treated to Date: 25
Plan Prescribed Dose Per Fraction: 2 Gy
Plan Total Fractions Prescribed: 40
Plan Total Prescribed Dose: 80 Gy
Reference Point Dosage Given to Date: 50 Gy
Reference Point Session Dosage Given: 2 Gy
Session Number: 25

## 2022-03-05 ENCOUNTER — Ambulatory Visit
Admission: RE | Admit: 2022-03-05 | Discharge: 2022-03-05 | Disposition: A | Discharge status: Home / Self Care | Payer: Medicare Other | Source: Ambulatory Visit | Attending: Radiation Oncology | Admitting: Radiation Oncology

## 2022-03-05 ENCOUNTER — Inpatient Hospital Stay: Payer: Medicare Other

## 2022-03-05 ENCOUNTER — Other Ambulatory Visit: Payer: Self-pay

## 2022-03-05 DIAGNOSIS — C61 Malignant neoplasm of prostate: Secondary | ICD-10-CM | POA: Diagnosis not present

## 2022-03-05 LAB — RAD ONC ARIA SESSION SUMMARY
Course Elapsed Days: 41
Plan Fractions Treated to Date: 26
Plan Prescribed Dose Per Fraction: 2 Gy
Plan Total Fractions Prescribed: 40
Plan Total Prescribed Dose: 80 Gy
Reference Point Dosage Given to Date: 52 Gy
Reference Point Session Dosage Given: 2 Gy
Session Number: 26

## 2022-03-05 LAB — CBC
HCT: 36.6 % — ABNORMAL LOW (ref 39.0–52.0)
Hemoglobin: 13.1 g/dL (ref 13.0–17.0)
MCH: 33.2 pg (ref 26.0–34.0)
MCHC: 35.8 g/dL (ref 30.0–36.0)
MCV: 92.7 fL (ref 80.0–100.0)
Platelets: 207 10*3/uL (ref 150–400)
RBC: 3.95 MIL/uL — ABNORMAL LOW (ref 4.22–5.81)
RDW: 11.8 % (ref 11.5–15.5)
WBC: 7.1 10*3/uL (ref 4.0–10.5)
nRBC: 0 % (ref 0.0–0.2)

## 2022-03-06 ENCOUNTER — Other Ambulatory Visit: Payer: Self-pay

## 2022-03-06 ENCOUNTER — Ambulatory Visit
Admission: RE | Admit: 2022-03-06 | Discharge: 2022-03-06 | Disposition: A | Discharge status: Home / Self Care | Payer: Medicare Other | Source: Ambulatory Visit | Attending: Radiation Oncology | Admitting: Radiation Oncology

## 2022-03-06 DIAGNOSIS — C61 Malignant neoplasm of prostate: Secondary | ICD-10-CM | POA: Diagnosis not present

## 2022-03-06 LAB — RAD ONC ARIA SESSION SUMMARY
Course Elapsed Days: 42
Plan Fractions Treated to Date: 27
Plan Prescribed Dose Per Fraction: 2 Gy
Plan Total Fractions Prescribed: 40
Plan Total Prescribed Dose: 80 Gy
Reference Point Dosage Given to Date: 54 Gy
Reference Point Session Dosage Given: 2 Gy
Session Number: 27

## 2022-03-07 ENCOUNTER — Ambulatory Visit
Admission: RE | Admit: 2022-03-07 | Discharge: 2022-03-07 | Disposition: A | Discharge status: Home / Self Care | Payer: Medicare Other | Source: Ambulatory Visit | Attending: Radiation Oncology | Admitting: Radiation Oncology

## 2022-03-07 ENCOUNTER — Other Ambulatory Visit: Payer: Self-pay

## 2022-03-07 DIAGNOSIS — C61 Malignant neoplasm of prostate: Secondary | ICD-10-CM | POA: Diagnosis not present

## 2022-03-07 LAB — RAD ONC ARIA SESSION SUMMARY
Course Elapsed Days: 43
Plan Fractions Treated to Date: 28
Plan Prescribed Dose Per Fraction: 2 Gy
Plan Total Fractions Prescribed: 40
Plan Total Prescribed Dose: 80 Gy
Reference Point Dosage Given to Date: 56 Gy
Reference Point Session Dosage Given: 2 Gy
Session Number: 28

## 2022-03-11 ENCOUNTER — Ambulatory Visit
Admission: RE | Admit: 2022-03-11 | Discharge: 2022-03-11 | Disposition: A | Discharge status: Home / Self Care | Payer: Medicare Other | Source: Ambulatory Visit | Attending: Radiation Oncology | Admitting: Radiation Oncology

## 2022-03-11 ENCOUNTER — Other Ambulatory Visit: Payer: Self-pay

## 2022-03-11 DIAGNOSIS — C61 Malignant neoplasm of prostate: Secondary | ICD-10-CM | POA: Diagnosis present

## 2022-03-11 LAB — RAD ONC ARIA SESSION SUMMARY
Course Elapsed Days: 47
Plan Fractions Treated to Date: 29
Plan Prescribed Dose Per Fraction: 2 Gy
Plan Total Fractions Prescribed: 40
Plan Total Prescribed Dose: 80 Gy
Reference Point Dosage Given to Date: 58 Gy
Reference Point Session Dosage Given: 2 Gy
Session Number: 29

## 2022-03-12 ENCOUNTER — Other Ambulatory Visit: Payer: Self-pay

## 2022-03-12 ENCOUNTER — Ambulatory Visit
Admission: RE | Admit: 2022-03-12 | Discharge: 2022-03-12 | Disposition: A | Discharge status: Home / Self Care | Payer: Medicare Other | Source: Ambulatory Visit | Attending: Radiation Oncology | Admitting: Radiation Oncology

## 2022-03-12 DIAGNOSIS — C61 Malignant neoplasm of prostate: Secondary | ICD-10-CM | POA: Diagnosis not present

## 2022-03-12 LAB — RAD ONC ARIA SESSION SUMMARY
Course Elapsed Days: 48
Plan Fractions Treated to Date: 30
Plan Prescribed Dose Per Fraction: 2 Gy
Plan Total Fractions Prescribed: 40
Plan Total Prescribed Dose: 80 Gy
Reference Point Dosage Given to Date: 60 Gy
Reference Point Session Dosage Given: 2 Gy
Session Number: 30

## 2022-03-13 ENCOUNTER — Ambulatory Visit
Admission: RE | Admit: 2022-03-13 | Discharge: 2022-03-13 | Disposition: A | Discharge status: Home / Self Care | Payer: Medicare Other | Source: Ambulatory Visit | Attending: Radiation Oncology | Admitting: Radiation Oncology

## 2022-03-13 ENCOUNTER — Other Ambulatory Visit: Payer: Self-pay

## 2022-03-13 DIAGNOSIS — C61 Malignant neoplasm of prostate: Secondary | ICD-10-CM | POA: Diagnosis not present

## 2022-03-13 LAB — RAD ONC ARIA SESSION SUMMARY
Course Elapsed Days: 49
Plan Fractions Treated to Date: 31
Plan Prescribed Dose Per Fraction: 2 Gy
Plan Total Fractions Prescribed: 40
Plan Total Prescribed Dose: 80 Gy
Reference Point Dosage Given to Date: 62 Gy
Reference Point Session Dosage Given: 2 Gy
Session Number: 31

## 2022-03-14 ENCOUNTER — Other Ambulatory Visit: Payer: Self-pay

## 2022-03-14 ENCOUNTER — Ambulatory Visit
Admission: RE | Admit: 2022-03-14 | Discharge: 2022-03-14 | Disposition: A | Discharge status: Home / Self Care | Payer: Medicare Other | Source: Ambulatory Visit | Attending: Radiation Oncology | Admitting: Radiation Oncology

## 2022-03-14 DIAGNOSIS — C61 Malignant neoplasm of prostate: Secondary | ICD-10-CM | POA: Diagnosis not present

## 2022-03-14 LAB — RAD ONC ARIA SESSION SUMMARY
Course Elapsed Days: 50
Plan Fractions Treated to Date: 32
Plan Prescribed Dose Per Fraction: 2 Gy
Plan Total Fractions Prescribed: 40
Plan Total Prescribed Dose: 80 Gy
Reference Point Dosage Given to Date: 64 Gy
Reference Point Session Dosage Given: 2 Gy
Session Number: 32

## 2022-03-17 ENCOUNTER — Ambulatory Visit
Admission: RE | Admit: 2022-03-17 | Discharge: 2022-03-17 | Disposition: A | Discharge status: Home / Self Care | Payer: Medicare Other | Source: Ambulatory Visit | Attending: Radiation Oncology | Admitting: Radiation Oncology

## 2022-03-17 ENCOUNTER — Ambulatory Visit: Payer: Medicare Other

## 2022-03-17 ENCOUNTER — Other Ambulatory Visit: Payer: Self-pay

## 2022-03-17 DIAGNOSIS — C61 Malignant neoplasm of prostate: Secondary | ICD-10-CM | POA: Diagnosis not present

## 2022-03-17 LAB — RAD ONC ARIA SESSION SUMMARY
Course Elapsed Days: 53
Plan Fractions Treated to Date: 33
Plan Prescribed Dose Per Fraction: 2 Gy
Plan Total Fractions Prescribed: 40
Plan Total Prescribed Dose: 80 Gy
Reference Point Dosage Given to Date: 66 Gy
Reference Point Session Dosage Given: 2 Gy
Session Number: 33

## 2022-03-18 ENCOUNTER — Ambulatory Visit
Admission: RE | Admit: 2022-03-18 | Discharge: 2022-03-18 | Disposition: A | Discharge status: Home / Self Care | Payer: Medicare Other | Source: Ambulatory Visit | Attending: Radiation Oncology | Admitting: Radiation Oncology

## 2022-03-18 ENCOUNTER — Ambulatory Visit: Payer: Medicare Other

## 2022-03-18 ENCOUNTER — Other Ambulatory Visit: Payer: Self-pay

## 2022-03-18 DIAGNOSIS — C61 Malignant neoplasm of prostate: Secondary | ICD-10-CM | POA: Diagnosis not present

## 2022-03-18 LAB — RAD ONC ARIA SESSION SUMMARY
Course Elapsed Days: 54
Plan Fractions Treated to Date: 34
Plan Prescribed Dose Per Fraction: 2 Gy
Plan Total Fractions Prescribed: 40
Plan Total Prescribed Dose: 80 Gy
Reference Point Dosage Given to Date: 68 Gy
Reference Point Session Dosage Given: 2 Gy
Session Number: 34

## 2022-03-19 ENCOUNTER — Other Ambulatory Visit: Payer: Self-pay

## 2022-03-19 ENCOUNTER — Ambulatory Visit
Admission: RE | Admit: 2022-03-19 | Discharge: 2022-03-19 | Disposition: A | Discharge status: Home / Self Care | Payer: Medicare Other | Source: Ambulatory Visit | Attending: Radiation Oncology | Admitting: Radiation Oncology

## 2022-03-19 ENCOUNTER — Ambulatory Visit: Payer: Medicare Other

## 2022-03-19 DIAGNOSIS — C61 Malignant neoplasm of prostate: Secondary | ICD-10-CM | POA: Diagnosis not present

## 2022-03-19 LAB — RAD ONC ARIA SESSION SUMMARY
Course Elapsed Days: 55
Plan Fractions Treated to Date: 35
Plan Prescribed Dose Per Fraction: 2 Gy
Plan Total Fractions Prescribed: 40
Plan Total Prescribed Dose: 80 Gy
Reference Point Dosage Given to Date: 70 Gy
Reference Point Session Dosage Given: 2 Gy
Session Number: 35

## 2022-03-20 ENCOUNTER — Other Ambulatory Visit: Payer: Self-pay

## 2022-03-20 ENCOUNTER — Ambulatory Visit
Admission: RE | Admit: 2022-03-20 | Discharge: 2022-03-20 | Disposition: A | Discharge status: Home / Self Care | Payer: Medicare Other | Source: Ambulatory Visit | Attending: Radiation Oncology | Admitting: Radiation Oncology

## 2022-03-20 DIAGNOSIS — C61 Malignant neoplasm of prostate: Secondary | ICD-10-CM | POA: Diagnosis not present

## 2022-03-20 LAB — RAD ONC ARIA SESSION SUMMARY
Course Elapsed Days: 56
Plan Fractions Treated to Date: 36
Plan Prescribed Dose Per Fraction: 2 Gy
Plan Total Fractions Prescribed: 40
Plan Total Prescribed Dose: 80 Gy
Reference Point Dosage Given to Date: 72 Gy
Reference Point Session Dosage Given: 2 Gy
Session Number: 36

## 2022-03-21 ENCOUNTER — Ambulatory Visit: Payer: Medicare Other

## 2022-03-24 ENCOUNTER — Ambulatory Visit
Admission: RE | Admit: 2022-03-24 | Discharge: 2022-03-24 | Disposition: A | Discharge status: Home / Self Care | Payer: Medicare Other | Source: Ambulatory Visit | Attending: Radiation Oncology | Admitting: Radiation Oncology

## 2022-03-24 ENCOUNTER — Other Ambulatory Visit: Payer: Self-pay

## 2022-03-24 DIAGNOSIS — C61 Malignant neoplasm of prostate: Secondary | ICD-10-CM | POA: Insufficient documentation

## 2022-03-24 DIAGNOSIS — Z510 Encounter for antineoplastic radiation therapy: Secondary | ICD-10-CM | POA: Diagnosis not present

## 2022-03-24 LAB — RAD ONC ARIA SESSION SUMMARY
Course Elapsed Days: 60
Plan Fractions Treated to Date: 37
Plan Prescribed Dose Per Fraction: 2 Gy
Plan Total Fractions Prescribed: 40
Plan Total Prescribed Dose: 80 Gy
Reference Point Dosage Given to Date: 74 Gy
Reference Point Session Dosage Given: 2 Gy
Session Number: 37

## 2022-03-25 ENCOUNTER — Ambulatory Visit
Admission: RE | Admit: 2022-03-25 | Discharge: 2022-03-25 | Disposition: A | Discharge status: Home / Self Care | Payer: Medicare Other | Source: Ambulatory Visit | Attending: Radiation Oncology | Admitting: Radiation Oncology

## 2022-03-25 ENCOUNTER — Other Ambulatory Visit: Payer: Self-pay

## 2022-03-25 ENCOUNTER — Ambulatory Visit: Payer: Medicare Other

## 2022-03-25 DIAGNOSIS — C61 Malignant neoplasm of prostate: Secondary | ICD-10-CM | POA: Diagnosis not present

## 2022-03-25 LAB — RAD ONC ARIA SESSION SUMMARY
Course Elapsed Days: 61
Plan Fractions Treated to Date: 38
Plan Prescribed Dose Per Fraction: 2 Gy
Plan Total Fractions Prescribed: 40
Plan Total Prescribed Dose: 80 Gy
Reference Point Dosage Given to Date: 76 Gy
Reference Point Session Dosage Given: 2 Gy
Session Number: 38

## 2022-03-26 ENCOUNTER — Ambulatory Visit: Payer: Medicare Other

## 2022-03-26 ENCOUNTER — Other Ambulatory Visit: Payer: Self-pay

## 2022-03-26 ENCOUNTER — Ambulatory Visit
Admission: RE | Admit: 2022-03-26 | Discharge: 2022-03-26 | Disposition: A | Discharge status: Home / Self Care | Payer: Medicare Other | Source: Ambulatory Visit

## 2022-03-26 DIAGNOSIS — C61 Malignant neoplasm of prostate: Secondary | ICD-10-CM | POA: Diagnosis not present

## 2022-03-26 LAB — RAD ONC ARIA SESSION SUMMARY
Course Elapsed Days: 62
Plan Fractions Treated to Date: 39
Plan Prescribed Dose Per Fraction: 2 Gy
Plan Total Fractions Prescribed: 40
Plan Total Prescribed Dose: 80 Gy
Reference Point Dosage Given to Date: 78 Gy
Reference Point Session Dosage Given: 2 Gy
Session Number: 39

## 2022-03-27 ENCOUNTER — Other Ambulatory Visit: Payer: Self-pay

## 2022-03-27 ENCOUNTER — Ambulatory Visit
Admission: RE | Admit: 2022-03-27 | Discharge: 2022-03-27 | Disposition: A | Discharge status: Home / Self Care | Payer: Medicare Other | Source: Ambulatory Visit

## 2022-03-27 DIAGNOSIS — C61 Malignant neoplasm of prostate: Secondary | ICD-10-CM | POA: Diagnosis not present

## 2022-03-27 LAB — RAD ONC ARIA SESSION SUMMARY
Course Elapsed Days: 63
Plan Fractions Treated to Date: 40
Plan Prescribed Dose Per Fraction: 2 Gy
Plan Total Fractions Prescribed: 40
Plan Total Prescribed Dose: 80 Gy
Reference Point Dosage Given to Date: 80 Gy
Reference Point Session Dosage Given: 2 Gy
Session Number: 40

## 2022-05-07 ENCOUNTER — Other Ambulatory Visit: Payer: Self-pay | Admitting: *Deleted

## 2022-05-07 ENCOUNTER — Ambulatory Visit
Admission: RE | Admit: 2022-05-07 | Discharge: 2022-05-07 | Disposition: A | Discharge status: Home / Self Care | Payer: Medicare Other | Source: Ambulatory Visit | Attending: Radiation Oncology | Admitting: Radiation Oncology

## 2022-05-07 ENCOUNTER — Encounter: Payer: Self-pay | Admitting: Radiation Oncology

## 2022-05-07 VITALS — BP 127/71 | HR 92 | Temp 98.0°F | Resp 16 | Ht 75.0 in | Wt 184.0 lb

## 2022-05-07 DIAGNOSIS — C61 Malignant neoplasm of prostate: Secondary | ICD-10-CM

## 2022-05-07 NOTE — Progress Notes (Signed)
Radiation Oncology Follow up Note  Name: Jimmy Wilson   Date:   05/07/2022 MRN:  [id number] DOB: 1950/07/15    This 72 y.o. male presents to the clinic today for 1 month follow-up status post IMRT radiation therapy to his prostate and pelvic nodes for stage IIIa (T1 cN0 M0) Gleason 7 (3+4) adenocarcinoma the prostate presenting with a PSA over 20.  REFERRING PROVIDER: Arthur Holms, NP  HPI: Patient is a 72 year old male now out 1 month having completed IMRT radiation therapy to his prostate and pelvic nodes plus ADT therapy for stage IIIa Gleason 7 adenocarcinoma the prostate.  He is seen today in routine follow-up and is doing well specifically denies any increased lower urinary tract symptoms diarrhea or fatigue..  COMPLICATIONS OF TREATMENT: none  FOLLOW UP COMPLIANCE: keeps appointments   PHYSICAL EXAM:  BP 127/71 (BP Location: Right Arm, Patient Position: Sitting, Cuff Size: Normal)   Pulse 92   Temp 98 F (36.7 C) (Tympanic)   Resp 16   Ht '6\' 3"'$  (1.905 m)   Wt 184 lb (83.5 kg)   BMI 23.00 kg/m  Well-developed well-nourished patient in NAD. HEENT reveals PERLA, EOMI, discs not visualized.  Oral cavity is clear. No oral mucosal lesions are identified. Neck is clear without evidence of cervical or supraclavicular adenopathy. Lungs are clear to A&P. Cardiac examination is essentially unremarkable with regular rate and rhythm without murmur rub or thrill. Abdomen is benign with no organomegaly or masses noted. Motor sensory and DTR levels are equal and symmetric in the upper and lower extremities. Cranial nerves II through XII are grossly intact. Proprioception is intact. No peripheral adenopathy or edema is identified. No motor or sensory levels are noted. Crude visual fields are within normal range.  RADIOLOGY RESULTS: No current films for review  PLAN: Present time patient is doing well very low side effect profile from his radiation therapy.  He is having some dental work performed  we will give him clearance for that to proceed.  I have asked to see him back in 3 months with a PSA at that time.  Patient is to call with any concerns.  I would like to take this opportunity to thank you for allowing me to participate in the care of your patient.Noreene Filbert, MD

## 2022-05-26 ENCOUNTER — Other Ambulatory Visit (INDEPENDENT_AMBULATORY_CARE_PROVIDER_SITE_OTHER): Payer: Self-pay | Admitting: Nurse Practitioner

## 2022-05-26 DIAGNOSIS — I739 Peripheral vascular disease, unspecified: Secondary | ICD-10-CM

## 2022-05-30 ENCOUNTER — Encounter (INDEPENDENT_AMBULATORY_CARE_PROVIDER_SITE_OTHER): Payer: Self-pay | Admitting: Nurse Practitioner

## 2022-05-30 ENCOUNTER — Ambulatory Visit (INDEPENDENT_AMBULATORY_CARE_PROVIDER_SITE_OTHER): Payer: Medicare Other

## 2022-05-30 ENCOUNTER — Ambulatory Visit (INDEPENDENT_AMBULATORY_CARE_PROVIDER_SITE_OTHER): Payer: Medicare Other | Admitting: Nurse Practitioner

## 2022-05-30 VITALS — BP 114/76 | HR 88 | Resp 16 | Ht 75.0 in | Wt 183.0 lb

## 2022-05-30 DIAGNOSIS — I10 Essential (primary) hypertension: Secondary | ICD-10-CM

## 2022-05-30 DIAGNOSIS — I739 Peripheral vascular disease, unspecified: Secondary | ICD-10-CM | POA: Diagnosis not present

## 2022-05-30 DIAGNOSIS — I70212 Atherosclerosis of native arteries of extremities with intermittent claudication, left leg: Secondary | ICD-10-CM

## 2022-06-02 ENCOUNTER — Encounter (INDEPENDENT_AMBULATORY_CARE_PROVIDER_SITE_OTHER): Payer: Self-pay | Admitting: Nurse Practitioner

## 2022-06-02 LAB — VAS US ABI WITH/WO TBI
Left ABI: 0.72
Right ABI: 1.03

## 2022-06-02 NOTE — H&P (View-Only) (Signed)
 Subjective:    Patient ID: Jimmy Wilson, male    DOB: 07/01/1950, 71 y.o.   MRN: [id number] Chief Complaint  Patient presents with   New Patient (Initial Visit)    Ultrasound and consult    Jimmy Wilson is a 71-year-old male who presents today for evaluation of possible peripheral arterial disease.  The patient has had significant claudication ongoing for the last several years.  Initially this was thought to be related to his lower back and degenerative disc disease.  The patient had prostate cancer and he was undergoing treatment.  These treatments are currently done.  However for the last 6 months he has been treated with Plavix and Pletal and has not noted any significant difference in his claudication-like symptoms or ambulatory distance.  Currently there are no open wounds or ulcerations.  Today noninvasive studies show an ABI of 1.03 on the right and 0.72 on the left.  There is a TBI of 0.75 on the right and 0.64 on the left.  The patient has biphasic/triphasic waveforms on the right with normal toe waveforms.  The patient has allergies biphasic waveforms in the left dampened toe waveforms.    Review of Systems  Cardiovascular:        Claudication  All other systems reviewed and are negative.      Objective:   Physical Exam Vitals reviewed.  HENT:     Head: Normocephalic.  Cardiovascular:     Rate and Rhythm: Normal rate.     Pulses:          Dorsalis pedis pulses are 1+ on the right side and detected w/ Doppler on the left side.       Posterior tibial pulses are 1+ on the right side and detected w/ Doppler on the left side.  Pulmonary:     Effort: Pulmonary effort is normal.  Skin:    General: Skin is warm and dry.  Neurological:     Mental Status: He is alert and oriented to person, place, and time.  Psychiatric:        Mood and Affect: Mood normal.        Behavior: Behavior normal.        Thought Content: Thought content normal.        Judgment: Judgment normal.      BP 114/76 (BP Location: Left Arm)   Pulse 88   Resp 16   Ht 6' 3" (1.905 m)   Wt 183 lb (83 kg)   BMI 22.87 kg/m   Past Medical History:  Diagnosis Date   Arthritis 10/22/2021   BACK PAIN 10/31/2008   Qualifier: Diagnosis of  By: Copland MD, Spencer     Chronic hepatitis C without hepatic coma (HCC) 02/21/2019   Cirrhosis (HCC) 02/21/2019   COPD (chronic obstructive pulmonary disease) (HCC)    HYPERTENSION 10/31/2008   Qualifier: Diagnosis of  By: Copland MD, Spencer     PSA, INCREASED 03/19/2009   Qualifier: Diagnosis of  By: Copland MD, Spencer      Social History   Socioeconomic History   Marital status: Single    Spouse name: Not on file   Number of children: Not on file   Years of education: Not on file   Highest education level: Not on file  Occupational History   Not on file  Tobacco Use   Smoking status: Every Day    Packs/day: 1.5    Types: Cigarettes   Smokeless tobacco: Never  Vaping   Use   Vaping Use: Never used  Substance and Sexual Activity   Alcohol use: Yes    Alcohol/week: 2.0 - 3.0 standard drinks of alcohol    Types: 2 - 3 Cans of beer per week    Comment: 3x per week   Drug use: No   Sexual activity: Not on file  Other Topics Concern   Not on file  Social History Narrative   Not on file   Social Determinants of Health   Financial Resource Strain: Low Risk  (12/18/2021)   Overall Financial Resource Strain (CARDIA)    Difficulty of Paying Living Expenses: Not very hard  Food Insecurity: No Food Insecurity (12/18/2021)   Hunger Vital Sign    Worried About Running Out of Food in the Last Year: Never true    Ran Out of Food in the Last Year: Never true  Transportation Needs: No Transportation Needs (12/18/2021)   PRAPARE - Transportation    Lack of Transportation (Medical): No    Lack of Transportation (Non-Medical): No  Physical Activity: Not on file  Stress: Not on file  Social Connections: Not on file  Intimate Partner  Violence: Not At Risk (12/18/2021)   Humiliation, Afraid, Rape, and Kick questionnaire    Fear of Current or Ex-Partner: No    Emotionally Abused: No    Physically Abused: No    Sexually Abused: No    Past Surgical History:  Procedure Laterality Date   NECK SURGERY      Family History  Problem Relation Age of Onset   Diabetes Father    Breast cancer Paternal Grandmother    Appendicitis Paternal Grandfather    Prostate cancer Neg Hx    Bladder Cancer Neg Hx    Kidney cancer Neg Hx     Allergies  Allergen Reactions   Aspirin Hypertension       Latest Ref Rng & Units 03/05/2022    9:59 AM 02/19/2022   10:01 AM 02/05/2022    9:57 AM  CBC  WBC 4.0 - 10.5 K/uL 7.1  9.4  7.7   Hemoglobin 13.0 - 17.0 g/dL 13.1  13.3  13.5   Hematocrit 39.0 - 52.0 % 36.6  38.6  38.8   Platelets 150 - 400 K/uL 207  244  237       CMP     Component Value Date/Time   NA 136 04/22/2019 1058   NA 140 01/12/2012 0939   K 4.5 04/22/2019 1058   K 4.3 01/12/2012 0939   CL 101 04/22/2019 1058   CL 109 (H) 01/12/2012 0939   CO2 27 04/22/2019 1058   CO2 23 01/12/2012 0939   GLUCOSE 100 (H) 04/22/2019 1058   GLUCOSE 96 01/12/2012 0939   BUN 14 04/22/2019 1058   BUN 8 01/12/2012 0939   CREATININE 0.89 04/22/2019 1058   CALCIUM 9.7 04/22/2019 1058   CALCIUM 8.7 01/12/2012 0939   PROT 7.3 04/22/2019 1058   PROT 7.8 01/12/2012 0939   ALBUMIN 4.1 06/23/2018 0946   ALBUMIN 3.9 01/12/2012 0939   AST 20 04/22/2019 1058   AST 50 (H) 01/12/2012 0939   ALT 23 04/22/2019 1058   ALT 44 01/12/2012 0939   ALKPHOS 94 06/23/2018 0946   ALKPHOS 89 01/12/2012 0939   BILITOT 0.7 04/22/2019 1058   BILITOT 0.6 01/12/2012 0939   GFRNONAA 77 02/21/2019 0909   GFRAA 89 02/21/2019 0909     VAS US ABI WITH/WO TBI  Result Date: 06/02/2022  LOWER EXTREMITY   DOPPLER STUDY Patient Name:  Jimmy Wilson  Date of Exam:   05/30/2022 Medical Rec #: [id number]      Accession #:    [id number] Date of Birth: 12/07/1950       Patient Gender: M Patient Age:   71 years Exam Location:  [facility] Procedure:      VAS US ABI WITH/WO TBI Referring Phys: Jason Dew --------------------------------------------------------------------------------  Indications: Claudication, and peripheral artery disease.  Performing Technologist: Solomon Mcclary RVS  Examination Guidelines: A complete evaluation includes at minimum, Doppler waveform signals and systolic blood pressure reading at the level of bilateral brachial, anterior tibial, and posterior tibial arteries, when vessel segments are accessible. Bilateral testing is considered an integral part of a complete examination. Photoelectric Plethysmograph (PPG) waveforms and toe systolic pressure readings are included as required and additional duplex testing as needed. Limited examinations for reoccurring indications may be performed as noted.  ABI Findings: +---------+------------------+-----+---------+--------+ Right    Rt Pressure (mmHg)IndexWaveform Comment  +---------+------------------+-----+---------+--------+ Brachial 148                                      +---------+------------------+-----+---------+--------+ ATA      153               1.03 triphasic         +---------+------------------+-----+---------+--------+ PTA      147               0.99 biphasic          +---------+------------------+-----+---------+--------+ Great Toe111               0.75 Normal            +---------+------------------+-----+---------+--------+ +---------+------------------+-----+--------+-------+ Left     Lt Pressure (mmHg)IndexWaveformComment +---------+------------------+-----+--------+-------+ Brachial 148                                    +---------+------------------+-----+--------+-------+ ATA      107               0.72 biphasic        +---------+------------------+-----+--------+-------+ PTA      100               0.68 biphasic         +---------+------------------+-----+--------+-------+ Great Toe94                0.64 Abnormal        +---------+------------------+-----+--------+-------+ +-------+-----------+-----------+------------+------------+ ABI/TBIToday's ABIToday's TBIPrevious ABIPrevious TBI +-------+-----------+-----------+------------+------------+ Right  1.03       .75                                 +-------+-----------+-----------+------------+------------+ Left   .72        .64                                 +-------+-----------+-----------+------------+------------+  Summary: Right: Resting right ankle-brachial index is within normal range. The right toe-brachial index is normal. Left: Resting left ankle-brachial index indicates moderate left lower extremity arterial disease. The left toe-brachial index is abnormal. *See table(s) above for measurements and observations.  Electronically signed by Jason Dew MD on 06/02/2022 at 8:41:29 AM.    Final          Assessment & Plan:   1. Atherosclerosis of native artery of left lower extremity with intermittent claudication (HCC) Recommend:  The patient has experienced increased claudication symptoms and is now describing lifestyle limiting claudication and appears to be having mild rest pain symptroms.  Given the severity of the patient's severe left lower extremity symptoms the patient should undergo angiography with the hope for intervention.  Risk and benefits were reviewed the patient.  Indications for the procedure were reviewed.  All questions were answered, the patient agrees to proceed with left lower extremity angiography and possible intervention.   The patient should continue walking and begin a more formal exercise program.  The patient should continue antiplatelet therapy and aggressive treatment of the lipid abnormalities  The patient will follow up with me after the angiogram.   2. Primary hypertension Continue antihypertensive  medications as already ordered, these medications have been reviewed and there are no changes at this time.   Current Outpatient Medications on File Prior to Visit  Medication Sig Dispense Refill   cilostazol (PLETAL) 100 MG tablet Take 100 mg by mouth 2 (two) times daily.     clopidogrel (PLAVIX) 75 MG tablet Take 75 mg by mouth daily.     Cyanocobalamin (VITAMIN B 12 PO) Take by mouth.     lisinopril-hydrochlorothiazide (ZESTORETIC) 10-12.5 MG tablet Take 1 tablet by mouth daily.     rosuvastatin (CRESTOR) 5 MG tablet SMARTSIG:1 Tablet(s) By Mouth Every Evening     tamsulosin (FLOMAX) 0.4 MG CAPS capsule Take 1 capsule (0.4 mg total) by mouth daily after supper. 90 capsule 3   No current facility-administered medications on file prior to visit.    There are no Patient Instructions on file for this visit. No follow-ups on file.   Markayla Reichart E Jaiven Graveline, NP   

## 2022-06-02 NOTE — Progress Notes (Signed)
Subjective:    Patient ID: Jimmy Wilson, male    DOB: June 02, 1950, 72 y.o.   MRN: [id number] Chief Complaint  Patient presents with   New Patient (Initial Visit)    Ultrasound and consult    Jimmy Wilson is a 72 year old male who presents today for evaluation of possible peripheral arterial disease.  The patient has had significant claudication ongoing for the last several years.  Initially this was thought to be related to his lower back and degenerative disc disease.  The patient had prostate cancer and he was undergoing treatment.  These treatments are currently done.  However for the last 6 months he has been treated with Plavix and Pletal and has not noted any significant difference in his claudication-like symptoms or ambulatory distance.  Currently there are no open wounds or ulcerations.  Today noninvasive studies show an ABI of 1.03 on the right and 0.72 on the left.  There is a TBI of 0.75 on the right and 0.64 on the left.  The patient has biphasic/triphasic waveforms on the right with normal toe waveforms.  The patient has allergies biphasic waveforms in the left dampened toe waveforms.    Review of Systems  Cardiovascular:        Claudication  All other systems reviewed and are negative.      Objective:   Physical Exam Vitals reviewed.  HENT:     Head: Normocephalic.  Cardiovascular:     Rate and Rhythm: Normal rate.     Pulses:          Dorsalis pedis pulses are 1+ on the right side and detected w/ Doppler on the left side.       Posterior tibial pulses are 1+ on the right side and detected w/ Doppler on the left side.  Pulmonary:     Effort: Pulmonary effort is normal.  Skin:    General: Skin is warm and dry.  Neurological:     Mental Status: He is alert and oriented to person, place, and time.  Psychiatric:        Mood and Affect: Mood normal.        Behavior: Behavior normal.        Thought Content: Thought content normal.        Judgment: Judgment normal.      BP 114/76 (BP Location: Left Arm)   Pulse 88   Resp 16   Ht 6\' 3"  (1.905 m)   Wt 183 lb (83 kg)   BMI 22.87 kg/m   Past Medical History:  Diagnosis Date   Arthritis 10/22/2021   BACK PAIN 10/31/2008   Qualifier: Diagnosis of  By: Lorelei Pont MD, Spencer     Chronic hepatitis C without hepatic coma ([place]) 02/21/2019   Cirrhosis ([place]) 02/21/2019   COPD (chronic obstructive pulmonary disease) ([place])    HYPERTENSION 10/31/2008   Qualifier: Diagnosis of  By: Lorelei Pont MD, Spencer     PSA, INCREASED 03/19/2009   Qualifier: Diagnosis of  By: Lorelei Pont MD, Frederico Hamman      Social History   Socioeconomic History   Marital status: Single    Spouse name: Not on file   Number of children: Not on file   Years of education: Not on file   Highest education level: Not on file  Occupational History   Not on file  Tobacco Use   Smoking status: Every Day    Packs/day: 1.5    Types: Cigarettes   Smokeless tobacco: Never  Vaping  Use   Vaping Use: Never used  Substance and Sexual Activity   Alcohol use: Yes    Alcohol/week: 2.0 - 3.0 standard drinks of alcohol    Types: 2 - 3 Cans of beer per week    Comment: 3x per week   Drug use: No   Sexual activity: Not on file  Other Topics Concern   Not on file  Social History Narrative   Not on file   Social Determinants of Health   Financial Resource Strain: Low Risk  (12/18/2021)   Overall Financial Resource Strain (CARDIA)    Difficulty of Paying Living Expenses: Not very hard  Food Insecurity: No Food Insecurity (12/18/2021)   Hunger Vital Sign    Worried About Running Out of Food in the Last Year: Never true    Ran Out of Food in the Last Year: Never true  Transportation Needs: No Transportation Needs (12/18/2021)   PRAPARE - Hydrologist (Medical): No    Lack of Transportation (Non-Medical): No  Physical Activity: Not on file  Stress: Not on file  Social Connections: Not on file  Intimate Partner  Violence: Not At Risk (12/18/2021)   Humiliation, Afraid, Rape, and Kick questionnaire    Fear of Current or Ex-Partner: No    Emotionally Abused: No    Physically Abused: No    Sexually Abused: No    Past Surgical History:  Procedure Laterality Date   NECK SURGERY      Family History  Problem Relation Age of Onset   Diabetes Father    Breast cancer Paternal Grandmother    Appendicitis Paternal Grandfather    Prostate cancer Neg Hx    Bladder Cancer Neg Hx    Kidney cancer Neg Hx     Allergies  Allergen Reactions   Aspirin Hypertension       Latest Ref Rng & Units 03/05/2022    9:59 AM 02/19/2022   10:01 AM 02/05/2022    9:57 AM  CBC  WBC 4.0 - 10.5 K/uL 7.1  9.4  7.7   Hemoglobin 13.0 - 17.0 g/dL 13.1  13.3  13.5   Hematocrit 39.0 - 52.0 % 36.6  38.6  38.8   Platelets 150 - 400 K/uL 207  244  237       CMP     Component Value Date/Time   NA 136 04/22/2019 1058   NA 140 01/12/2012 0939   K 4.5 04/22/2019 1058   K 4.3 01/12/2012 0939   CL 101 04/22/2019 1058   CL 109 (H) 01/12/2012 0939   CO2 27 04/22/2019 1058   CO2 23 01/12/2012 0939   GLUCOSE 100 (H) 04/22/2019 1058   GLUCOSE 96 01/12/2012 0939   BUN 14 04/22/2019 1058   BUN 8 01/12/2012 0939   CREATININE 0.89 04/22/2019 1058   CALCIUM 9.7 04/22/2019 1058   CALCIUM 8.7 01/12/2012 0939   PROT 7.3 04/22/2019 1058   PROT 7.8 01/12/2012 0939   ALBUMIN 4.1 06/23/2018 0946   ALBUMIN 3.9 01/12/2012 0939   AST 20 04/22/2019 1058   AST 50 (H) 01/12/2012 0939   ALT 23 04/22/2019 1058   ALT 44 01/12/2012 0939   ALKPHOS 94 06/23/2018 0946   ALKPHOS 89 01/12/2012 0939   BILITOT 0.7 04/22/2019 1058   BILITOT 0.6 01/12/2012 0939   GFRNONAA 77 02/21/2019 0909   GFRAA 89 02/21/2019 0909     VAS [place] ABI WITH/WO TBI  Result Date: 06/02/2022  LOWER EXTREMITY  DOPPLER STUDY Patient Name:  Jimmy Wilson  Date of Exam:   05/30/2022 Medical Rec #: [id number]      Accession #:    [id number] Date of Birth: 12/22/1950       Patient Gender: M Patient Age:   2 years Exam Location:  [facility] Procedure:      VAS [place] ABI WITH/WO TBI Referring Phys: Leotis Pain --------------------------------------------------------------------------------  Indications: Claudication, and peripheral artery disease.  Performing Technologist: Almira Coaster RVS  Examination Guidelines: A complete evaluation includes at minimum, Doppler waveform signals and systolic blood pressure reading at the level of bilateral brachial, anterior tibial, and posterior tibial arteries, when vessel segments are accessible. Bilateral testing is considered an integral part of a complete examination. Photoelectric Plethysmograph (PPG) waveforms and toe systolic pressure readings are included as required and additional duplex testing as needed. Limited examinations for reoccurring indications may be performed as noted.  ABI Findings: +---------+------------------+-----+---------+--------+ Right    Rt Pressure (mmHg)IndexWaveform Comment  +---------+------------------+-----+---------+--------+ Brachial 148                                      +---------+------------------+-----+---------+--------+ ATA      153               1.03 triphasic         +---------+------------------+-----+---------+--------+ PTA      147               0.99 biphasic          +---------+------------------+-----+---------+--------+ Great Toe111               0.75 Normal            +---------+------------------+-----+---------+--------+ +---------+------------------+-----+--------+-------+ Left     Lt Pressure (mmHg)IndexWaveformComment +---------+------------------+-----+--------+-------+ Brachial 148                                    +---------+------------------+-----+--------+-------+ ATA      107               0.72 biphasic        +---------+------------------+-----+--------+-------+ PTA      100               0.68 biphasic         +---------+------------------+-----+--------+-------+ Great Toe94                0.64 Abnormal        +---------+------------------+-----+--------+-------+ +-------+-----------+-----------+------------+------------+ ABI/TBIToday's ABIToday's TBIPrevious ABIPrevious TBI +-------+-----------+-----------+------------+------------+ Right  1.03       .75                                 +-------+-----------+-----------+------------+------------+ Left   .72        .64                                 +-------+-----------+-----------+------------+------------+  Summary: Right: Resting right ankle-brachial index is within normal range. The right toe-brachial index is normal. Left: Resting left ankle-brachial index indicates moderate left lower extremity arterial disease. The left toe-brachial index is abnormal. *See table(s) above for measurements and observations.  Electronically signed by Leotis Pain MD on 06/02/2022 at 8:41:29 AM.    Final  Assessment & Plan:   1. Atherosclerosis of native artery of left lower extremity with intermittent claudication (HCC) Recommend:  The patient has experienced increased claudication symptoms and is now describing lifestyle limiting claudication and appears to be having mild rest pain symptroms.  Given the severity of the patient's severe left lower extremity symptoms the patient should undergo angiography with the hope for intervention.  Risk and benefits were reviewed the patient.  Indications for the procedure were reviewed.  All questions were answered, the patient agrees to proceed with left lower extremity angiography and possible intervention.   The patient should continue walking and begin a more formal exercise program.  The patient should continue antiplatelet therapy and aggressive treatment of the lipid abnormalities  The patient will follow up with me after the angiogram.   2. Primary hypertension Continue antihypertensive  medications as already ordered, these medications have been reviewed and there are no changes at this time.   Current Outpatient Medications on File Prior to Visit  Medication Sig Dispense Refill   cilostazol (PLETAL) 100 MG tablet Take 100 mg by mouth 2 (two) times daily.     clopidogrel (PLAVIX) 75 MG tablet Take 75 mg by mouth daily.     Cyanocobalamin (VITAMIN B 12 PO) Take by mouth.     lisinopril-hydrochlorothiazide (ZESTORETIC) 10-12.5 MG tablet Take 1 tablet by mouth daily.     rosuvastatin (CRESTOR) 5 MG tablet SMARTSIG:1 Tablet(s) By Mouth Every Evening     tamsulosin (FLOMAX) 0.4 MG CAPS capsule Take 1 capsule (0.4 mg total) by mouth daily after supper. 90 capsule 3   No current facility-administered medications on file prior to visit.    There are no Patient Instructions on file for this visit. No follow-ups on file.   Kris Hartmann, NP

## 2022-06-12 ENCOUNTER — Telehealth (INDEPENDENT_AMBULATORY_CARE_PROVIDER_SITE_OTHER): Payer: Self-pay

## 2022-06-12 NOTE — Telephone Encounter (Signed)
Spoke with the patient and he is scheduled with Dr. Lucky Cowboy on 06/16/22 with a 12:45 pm arrival time to the [facility]. Pre-procedure instructions were discussed and patient stated he wrote them down. Patient does not have Mychart.

## 2022-06-16 ENCOUNTER — Encounter: Payer: Self-pay | Admitting: Vascular Surgery

## 2022-06-16 ENCOUNTER — Ambulatory Visit
Admission: RE | Admit: 2022-06-16 | Discharge: 2022-06-16 | Disposition: A | Discharge status: Home / Self Care | Payer: Medicare Other | Attending: Vascular Surgery | Admitting: Vascular Surgery

## 2022-06-16 ENCOUNTER — Other Ambulatory Visit: Payer: Self-pay

## 2022-06-16 ENCOUNTER — Encounter
Admission: RE | Disposition: A | Discharge status: Home / Self Care | Payer: Self-pay | Source: Home / Self Care | Attending: Vascular Surgery

## 2022-06-16 DIAGNOSIS — I10 Essential (primary) hypertension: Secondary | ICD-10-CM | POA: Diagnosis not present

## 2022-06-16 DIAGNOSIS — Z8546 Personal history of malignant neoplasm of prostate: Secondary | ICD-10-CM | POA: Insufficient documentation

## 2022-06-16 DIAGNOSIS — I70212 Atherosclerosis of native arteries of extremities with intermittent claudication, left leg: Secondary | ICD-10-CM | POA: Diagnosis present

## 2022-06-16 DIAGNOSIS — F17210 Nicotine dependence, cigarettes, uncomplicated: Secondary | ICD-10-CM | POA: Insufficient documentation

## 2022-06-16 DIAGNOSIS — Z7902 Long term (current) use of antithrombotics/antiplatelets: Secondary | ICD-10-CM | POA: Insufficient documentation

## 2022-06-16 DIAGNOSIS — I70219 Atherosclerosis of native arteries of extremities with intermittent claudication, unspecified extremity: Secondary | ICD-10-CM

## 2022-06-16 HISTORY — PX: LOWER EXTREMITY ANGIOGRAPHY: CATH118251

## 2022-06-16 LAB — CREATININE, SERUM
Creatinine, Ser: 1 mg/dL (ref 0.61–1.24)
GFR, Estimated: >60 mL/min (ref >60)

## 2022-06-16 LAB — BUN: BUN: 23 mg/dL (ref 8–23)

## 2022-06-16 SURGERY — LOWER EXTREMITY ANGIOGRAPHY
Anesthesia: Moderate Sedation | Site: Leg Lower | Laterality: Left

## 2022-06-16 MED ORDER — DIPHENHYDRAMINE HCL 50 MG/ML IJ SOLN: 50.0000 mg | Freq: Once | INTRAMUSCULAR | Status: DC | PRN

## 2022-06-16 MED ORDER — MIDAZOLAM HCL 2 MG/ML PO SYRP: 8.0000 mg | ORAL_SOLUTION | Freq: Once | ORAL | Status: DC | PRN

## 2022-06-16 MED ORDER — CEFAZOLIN SODIUM-DEXTROSE 2-4 GM/100ML-% IV SOLN
2.0000 g | INTRAVENOUS | Status: AC
  Administered 2022-06-16: 2 g via INTRAVENOUS

## 2022-06-16 MED ORDER — IODIXANOL 320 MG/ML IV SOLN
INTRAVENOUS | Status: DC | PRN
  Administered 2022-06-16: 50 mL

## 2022-06-16 MED ORDER — FAMOTIDINE 20 MG PO TABS: 40.0000 mg | ORAL_TABLET | Freq: Once | ORAL | Status: DC | PRN

## 2022-06-16 MED ORDER — FENTANYL CITRATE (PF) 100 MCG/2ML IJ SOLN
INTRAMUSCULAR | Status: DC | PRN
  Administered 2022-06-16: 50 ug via INTRAVENOUS

## 2022-06-16 MED ORDER — HEPARIN SODIUM (PORCINE) 1000 UNIT/ML IJ SOLN
INTRAMUSCULAR | Status: DC | PRN
  Administered 2022-06-16: 5000 [IU] via INTRAVENOUS

## 2022-06-16 MED ORDER — HYDROMORPHONE HCL 1 MG/ML IJ SOLN: 1.0000 mg | Freq: Once | INTRAMUSCULAR | Status: DC | PRN

## 2022-06-16 MED ORDER — HEPARIN SODIUM (PORCINE) 1000 UNIT/ML IJ SOLN
INTRAMUSCULAR | Status: AC
  Filled 2022-06-16: qty 10

## 2022-06-16 MED ORDER — MIDAZOLAM HCL 2 MG/2ML IJ SOLN
INTRAMUSCULAR | Status: DC | PRN
  Administered 2022-06-16: 2 mg via INTRAVENOUS

## 2022-06-16 MED ORDER — FENTANYL CITRATE (PF) 100 MCG/2ML IJ SOLN
INTRAMUSCULAR | Status: AC
  Filled 2022-06-16: qty 2

## 2022-06-16 MED ORDER — SODIUM CHLORIDE 0.9 % IV SOLN: INTRAVENOUS | Status: DC

## 2022-06-16 MED ORDER — CEFAZOLIN SODIUM-DEXTROSE 2-4 GM/100ML-% IV SOLN
INTRAVENOUS | Status: AC
  Filled 2022-06-16: qty 100

## 2022-06-16 MED ORDER — MIDAZOLAM HCL 2 MG/2ML IJ SOLN
INTRAMUSCULAR | Status: AC
  Filled 2022-06-16: qty 2

## 2022-06-16 MED ORDER — ONDANSETRON HCL 4 MG/2ML IJ SOLN: 4.0000 mg | Freq: Four times a day (QID) | INTRAMUSCULAR | Status: DC | PRN

## 2022-06-16 MED ORDER — METHYLPREDNISOLONE SODIUM SUCC 125 MG IJ SOLR: 125.0000 mg | Freq: Once | INTRAMUSCULAR | Status: DC | PRN

## 2022-06-16 SURGICAL SUPPLY — 15 items
CATH ANGIO 5F PIGTAIL 65CM (CATHETERS) IMPLANT
CATH BEACON 5 .038 100 VERT TP (CATHETERS) IMPLANT
CATH NAVICROSS ANGLED 135CM (MICROCATHETER) IMPLANT
COVER PROBE ULTRASOUND 5X96 (MISCELLANEOUS) IMPLANT
DEVICE STARCLOSE SE CLOSURE (Vascular Products) IMPLANT
GLIDEWIRE ADV .035X260CM (WIRE) IMPLANT
GUIDEWIRE PFTE-COATED .018X300 (WIRE) IMPLANT
KIT ENCORE 26 ADVANTAGE (KITS) IMPLANT
PACK ANGIOGRAPHY (CUSTOM PROCEDURE TRAY) ×1 IMPLANT
SHEATH ANL2 6FRX45 HC (SHEATH) IMPLANT
SHEATH BRITE TIP 5FRX11 (SHEATH) IMPLANT
SYR MEDRAD MARK 7 150ML (SYRINGE) IMPLANT
TUBING CONTRAST HIGH PRESS 72 (TUBING) IMPLANT
WIRE GUIDERIGHT .035X150 (WIRE) IMPLANT
WIRE SHEPHERD 30G .018 (WIRE) IMPLANT

## 2022-06-16 NOTE — Progress Notes (Signed)
Care assume att, report from [place], RN, pt eating and watching, NAD, ride (friend, Tenny Craw) called for pick up at [employer]

## 2022-06-16 NOTE — Interval H&P Note (Signed)
History and Physical Interval Note:  06/16/2022 12:49 PM  Jimmy Wilson  has presented today for surgery, with the diagnosis of LLE Angio   BARD   ASO w claudication.  The various methods of treatment have been discussed with the patient and family. After consideration of risks, benefits and other options for treatment, the patient has consented to  Procedure(s): Lower Extremity Angiography (Left) as a surgical intervention.  The patient's history has been reviewed, patient examined, no change in status, stable for surgery.  I have reviewed the patient's chart and labs.  Questions were answered to the patient's satisfaction.     Festus Barren

## 2022-06-16 NOTE — Op Note (Signed)
[facility]  Percutaneous Study/Intervention Procedural Note   Date of Surgery: 06/16/2022  Surgeon(s):Nasra Counce    Assistants:none  Pre-operative Diagnosis: PAD with claudication left lower extremity  Post-operative diagnosis:  Same  Procedure(s) Performed:             1.  Ultrasound guidance for vascular access right femoral artery             2.  Catheter placement into left SFA from right femoral approach             3.  Aortogram and selective left lower extremity angiogram             4.  StarClose closure device right femoral artery  EBL: 10 cc  Contrast: 50 cc  Fluoro Time: 17.6 minutes  Moderate Conscious Sedation Time: approximately 56 minutes using 2 mg of Versed and 50 mcg of Fentanyl              Indications:  Patient is a 72 y.o.male with disabling claudication symptoms of the left lower extremity. The patient has noninvasive study showing a right ABI in the normal range with a left ABI of 0.7. The patient is brought in for angiography for further evaluation and potential treatment.  Risks and benefits are discussed and informed consent is obtained.   Procedure:  The patient was identified and appropriate procedural time out was performed.  The patient was then placed supine on the table and prepped and draped in the usual sterile fashion. Moderate conscious sedation was administered during a face to face encounter with the patient throughout the procedure with my supervision of the RN administering medicines and monitoring the patient's vital signs, pulse oximetry, telemetry and mental status throughout from the start of the procedure until the patient was taken to the recovery room. Ultrasound was used to evaluate the right common femoral artery.  It was patent .  A digital ultrasound image was acquired.  A Seldinger needle was used to access the right common femoral artery under direct ultrasound guidance and a permanent image was performed.  A  0.035 J wire was advanced without resistance and a 5Fr sheath was placed.  Pigtail catheter was placed into the aorta and an AP aortogram was performed. This demonstrated normal renal arteries and normal aorta and iliac segments without significant stenosis. I then crossed the aortic bifurcation and advanced to the left femoral head. Selective left lower extremity angiogram was then performed. This demonstrated significant irregularity in the left common femoral artery although it did not appear to have any high-grade stenosis.  The profunda femoris artery was large and patent.  The SFA had a large calcific chunk at the origin with a near occlusive stenosis, and then about 4 to 5 cm downstream it did occlude and had large collaterals.  There was reconstitution of the above-knee popliteal artery just below Hunter's canal through large collaterals.  Although the runoff was fairly sluggish, there did not appear to be any significant tibial disease with three-vessel runoff distally. It was felt that it was in the patient's best interest to proceed with intervention after these images to avoid a second procedure and a larger amount of contrast and fluoroscopy based off of the findings from the initial angiogram. The patient was systemically heparinized and a 6 [place] Ansell sheath was then placed over the [employer] wire. I then used a Kumpe catheter and the advantage wire to get into the SFA and tediously try to  cross the occlusion.  Used a variety of catheters including CXI and Kumpe catheters and multiple wires including 0.035 advantage wire, 0.018 advantage wire, and the 0.018 shepherd wire but was never able to regain intraluminal flow in the popliteal artery.  He still had an adequate target for a femoral to popliteal bypass or a pedal approach for attempt at crossing, and only reached almost 20 minutes of fluoroscopy and it was clear we were not going to cross this lesion today, I elected to terminate the  procedure. The sheath was removed and StarClose closure device was deployed in the right femoral artery with excellent hemostatic result. The patient was taken to the recovery room in stable condition having tolerated the procedure well.  Findings:               Aortogram:  This demonstrated normal renal arteries and normal aorta and iliac segments without significant stenosis although the right iliac was not particularly well-seen due to bowel gas and angle.             Left Lower Extremity:  This demonstrated significant irregularity in the left common femoral artery although it did not appear to have any high-grade stenosis.  The profunda femoris artery was large and patent.  The SFA had a large calcific chunk at the origin with a near occlusive stenosis, and then about 4 to 5 cm downstream it did occlude and had large collaterals.  There was reconstitution of the above-knee popliteal artery just below Hunter's canal through large collaterals.  Although the runoff was fairly sluggish, there did not appear to be any significant tibial disease with three-vessel runoff distally.   Disposition: Patient was taken to the recovery room in stable condition having tolerated the procedure well.  Complications: None  Festus Barren 06/16/2022 3:35 PM   This note was created with Dragon Medical transcription system. Any errors in dictation are purely unintentional.

## 2022-06-17 ENCOUNTER — Encounter: Payer: Self-pay | Admitting: Vascular Surgery

## 2022-06-26 ENCOUNTER — Other Ambulatory Visit: Payer: Self-pay

## 2022-06-26 ENCOUNTER — Other Ambulatory Visit: Payer: Self-pay | Admitting: *Deleted

## 2022-06-26 DIAGNOSIS — R972 Elevated prostate specific antigen [PSA]: Secondary | ICD-10-CM

## 2022-06-26 DIAGNOSIS — C61 Malignant neoplasm of prostate: Secondary | ICD-10-CM

## 2022-06-27 ENCOUNTER — Other Ambulatory Visit: Payer: Medicare Other

## 2022-06-27 DIAGNOSIS — C61 Malignant neoplasm of prostate: Secondary | ICD-10-CM

## 2022-06-27 DIAGNOSIS — R972 Elevated prostate specific antigen [PSA]: Secondary | ICD-10-CM

## 2022-06-28 LAB — PSA: Prostate Specific Ag, Serum: 0.2 ng/mL (ref 0.0–4.0)

## 2022-07-02 ENCOUNTER — Ambulatory Visit (INDEPENDENT_AMBULATORY_CARE_PROVIDER_SITE_OTHER): Payer: Medicare Other | Admitting: Urology

## 2022-07-02 VITALS — BP 144/74 | HR 83 | Ht 75.0 in | Wt 185.0 lb

## 2022-07-02 DIAGNOSIS — C61 Malignant neoplasm of prostate: Secondary | ICD-10-CM

## 2022-07-02 MED ORDER — LEUPROLIDE ACETATE (6 MONTH) 45 MG ~~LOC~~ KIT
45.0000 mg | PACK | Freq: Once | SUBCUTANEOUS | Status: AC
Start: 2022-07-02 — End: 2022-07-02
  Administered 2022-07-02: 45 mg via SUBCUTANEOUS

## 2022-07-02 NOTE — Progress Notes (Unsigned)
Eligard SubQ Injection   Due to Prostate Cancer patient is present today for a Eligard Injection.  Medication: Eligard every 6  months Dose: 45 mg  Location: right  Lot: [id number] Exp: 10/2023  Patient tolerated well, no complications were noted  Performed by: Mistina Coatney H RMA  Per Dr. Apolinar Junes patient is to continue therapy until 2025 at least . Patient's next follow up was scheduled for 12/23/22. This appointment was scheduled using wheel and given to patient today along with reminder continue on Vitamin D 800-1000iu and Calcium 1000-1200mg  daily while on Androgen Deprivation Therapy.  PA approval dates:  12/30/2021-12/31/2022

## 2022-07-02 NOTE — Progress Notes (Signed)
Marcelle Overlie Plume,acting as a scribe for Vanna Scotland, MD.,have documented all relevant documentation on the behalf of Vanna Scotland, MD,as directed by  Vanna Scotland, MD while in the presence of Vanna Scotland, MD.  07/02/2022 10:55 AM   Ilda Foil Stout 1950/10/09 [id number]  Referring provider: Loura Back, NP [address] [place],  [place] [address]  Chief Complaint  Patient presents with   Follow-up    HPI: 72 year-old male with prostate cancer who presents today for follow up. He was diagnosed with technically high-risk prostate cancer with a PSA 23.9 and a TRUS volume of 86.9. A biopsy was consistent with high-volume Gleason 3+4 bilaterally, up to 100% of the tissue. A subsequent PET scan was negative. He was started on ADT in 11/2021. He completed IMRT in 03/2022.   His PSA is trending back downwards to 0.2 on 06/27/2022.   Today, he reports side effects from radiation therapy, including diarrhea and dysuria, which have since resolved. He continues to experience hot flashes due to hormone therapy. The patient also reports taking calcium and vitamin D supplements and has concerns about neuropathy in his legs, questioning the potential need for vitamin B12 supplementation. He is currently on Flomax.    PMH: Past Medical History:  Diagnosis Date   Arthritis 10/22/2021   BACK PAIN 10/31/2008   Qualifier: Diagnosis of  By: Patsy Lager MD, Spencer     Chronic hepatitis C without hepatic coma 02/21/2019   Cirrhosis 02/21/2019   COPD (chronic obstructive pulmonary disease)    HYPERTENSION 10/31/2008   Qualifier: Diagnosis of  By: Patsy Lager MD, Spencer     PSA, INCREASED 03/19/2009   Qualifier: Diagnosis of  By: Patsy Lager MD, Spencer      Surgical History: Past Surgical History:  Procedure Laterality Date   LOWER EXTREMITY ANGIOGRAPHY Left 06/16/2022   Procedure: Lower Extremity Angiography;  Surgeon: Annice Needy, MD;  Location: [facility] INVASIVE CV LAB;  Service: Cardiovascular;   Laterality: Left;   NECK SURGERY      Home Medications:  Allergies as of 07/02/2022       Reactions   Aspirin Hypertension        Medication List        Accurate as of July 02, 2022 10:55 AM. If you have any questions, ask your nurse or doctor.          cilostazol 100 MG tablet Commonly known as: PLETAL Take 100 mg by mouth 2 (two) times daily.   clopidogrel 75 MG tablet Commonly known as: PLAVIX Take 75 mg by mouth daily.   lisinopril-hydrochlorothiazide 10-12.5 MG tablet Commonly known as: ZESTORETIC Take 1 tablet by mouth daily.   rosuvastatin 5 MG tablet Commonly known as: CRESTOR SMARTSIG:1 Tablet(s) By Mouth Every Evening   tamsulosin 0.4 MG Caps capsule Commonly known as: FLOMAX Take 1 capsule (0.4 mg total) by mouth daily after supper.   VITAMIN B 12 PO Take by mouth.        Allergies:  Allergies  Allergen Reactions   Aspirin Hypertension    Family History: Family History  Problem Relation Age of Onset   Diabetes Father    Breast cancer Paternal Grandmother    Appendicitis Paternal Grandfather    Prostate cancer Neg Hx    Bladder Cancer Neg Hx    Kidney cancer Neg Hx     Social History:  reports that he has been smoking cigarettes. He has been smoking an average of 1.5 packs per day. He has never  used smokeless tobacco. He reports current alcohol use of about 2.0 - 3.0 standard drinks of alcohol per week. He reports that he does not use drugs.   Physical Exam: BP (!) 144/74   Pulse 83   Ht  (1.905 m)   Wt 185 lb (83.9 kg)   BMI 23.12 kg/m   Constitutional:  Alert and oriented, No acute distress. HEENT: [place] AT, moist mucus membranes.  Trachea midline, no masses. Neurologic: Grossly intact, no focal deficits, moving all 4 extremities. Psychiatric: Normal mood and affect.   Assessment & Plan:    1. Prostate cancer - High-risk based on his PSA.  - Reasonable to complete at least 1 year, possibly 2 of ADT - PSA remains  detectable, which is mildly concerning - Eligard injection today - Continue to manage urinary symptoms with Flomax as needed   Return in about 6 months (around 01/01/2023) for Eligard injection.   [facility] [address], [address] [place], [place] [address] [phone]

## 2022-07-04 ENCOUNTER — Ambulatory Visit (INDEPENDENT_AMBULATORY_CARE_PROVIDER_SITE_OTHER): Payer: Medicare Other

## 2022-07-04 ENCOUNTER — Encounter (INDEPENDENT_AMBULATORY_CARE_PROVIDER_SITE_OTHER): Payer: Self-pay | Admitting: Vascular Surgery

## 2022-07-04 ENCOUNTER — Ambulatory Visit (INDEPENDENT_AMBULATORY_CARE_PROVIDER_SITE_OTHER): Payer: Medicare Other | Admitting: Vascular Surgery

## 2022-07-04 ENCOUNTER — Other Ambulatory Visit (INDEPENDENT_AMBULATORY_CARE_PROVIDER_SITE_OTHER): Payer: Self-pay | Admitting: Vascular Surgery

## 2022-07-04 VITALS — BP 129/81 | HR 71 | Resp 18 | Ht 75.0 in | Wt 180.0 lb

## 2022-07-04 DIAGNOSIS — I70212 Atherosclerosis of native arteries of extremities with intermittent claudication, left leg: Secondary | ICD-10-CM

## 2022-07-04 DIAGNOSIS — I10 Essential (primary) hypertension: Secondary | ICD-10-CM | POA: Diagnosis not present

## 2022-07-04 DIAGNOSIS — I70219 Atherosclerosis of native arteries of extremities with intermittent claudication, unspecified extremity: Secondary | ICD-10-CM | POA: Insufficient documentation

## 2022-07-04 NOTE — Progress Notes (Signed)
MRN : [id number]  Jimmy Wilson is a 72 y.o. (06/12/1950) male who presents with chief complaint of  Chief Complaint  Patient presents with   Follow-up    Follow up in 2 weeks with saphenous vein mapping  .  History of Present Illness: Patient returns today in follow up of his PAD.  He underwent an angiogram a couple of weeks ago while he was in the hospital.  He has SFA occlusion the cross.  He has anatomy for a femoral to above-knee popliteal bypass.  We also discussed another attempt from a pedal approach if he would desire.  He does not have rest pain or ulceration.  He does have pretty significant claudication symptoms.  We did saphenous vein mapping which showed his saphenous vein to be relatively small in both lower extremities.  Current Outpatient Medications  Medication Sig Dispense Refill   cilostazol (PLETAL) 100 MG tablet Take 100 mg by mouth 2 (two) times daily.     clopidogrel (PLAVIX) 75 MG tablet Take 75 mg by mouth daily.     Cyanocobalamin (VITAMIN B 12 PO) Take by mouth.     lisinopril-hydrochlorothiazide (ZESTORETIC) 10-12.5 MG tablet Take 1 tablet by mouth daily.     rosuvastatin (CRESTOR) 5 MG tablet SMARTSIG:1 Tablet(s) By Mouth Every Evening     tamsulosin (FLOMAX) 0.4 MG CAPS capsule Take 1 capsule (0.4 mg total) by mouth daily after supper. (Patient not taking: Reported on 07/04/2022) 90 capsule 3   No current facility-administered medications for this visit.    Past Medical History:  Diagnosis Date   Arthritis 10/22/2021   BACK PAIN 10/31/2008   Qualifier: Diagnosis of  By: Patsy Lager MD, Spencer     Chronic hepatitis C without hepatic coma (HCC) 02/21/2019   Cirrhosis (HCC) 02/21/2019   COPD (chronic obstructive pulmonary disease) (HCC)    HYPERTENSION 10/31/2008   Qualifier: Diagnosis of  By: Patsy Lager MD, Spencer     PSA, INCREASED 03/19/2009   Qualifier: Diagnosis of  By: Patsy Lager MD, Karleen Hampshire      Past Surgical History:  Procedure Laterality Date    LOWER EXTREMITY ANGIOGRAPHY Left 06/16/2022   Procedure: Lower Extremity Angiography;  Surgeon: Annice Needy, MD;  Location: [facility] INVASIVE CV LAB;  Service: Cardiovascular;  Laterality: Left;   NECK SURGERY       Social History   Tobacco Use   Smoking status: Every Day    Packs/day: 1.5    Types: Cigarettes   Smokeless tobacco: Never  Vaping Use   Vaping Use: Never used  Substance Use Topics   Alcohol use: Yes    Alcohol/week: 2.0 - 3.0 standard drinks of alcohol    Types: 2 - 3 Cans of beer per week    Comment: 3x per week   Drug use: No      Family History  Problem Relation Age of Onset   Diabetes Father    Breast cancer Paternal Grandmother    Appendicitis Paternal Grandfather    Prostate cancer Neg Hx    Bladder Cancer Neg Hx    Kidney cancer Neg Hx      Allergies  Allergen Reactions   Aspirin Hypertension     REVIEW OF SYSTEMS (Negative unless checked)  Constitutional: [] Weight loss  [] Fever  [] Chills Cardiac: [] Chest pain   [] Chest pressure   [] Palpitations   [] Shortness of breath when laying flat   [] Shortness of breath at rest   [] Shortness of breath with exertion. Vascular:  [x] Pain  in legs with walking   [] Pain in legs at rest   [] Pain in legs when laying flat   [x] Claudication   [] Pain in feet when walking  [] Pain in feet at rest  [] Pain in feet when laying flat   [] History of DVT   [] Phlebitis   [] Swelling in legs   [] Varicose veins   [] Non-healing ulcers Pulmonary:   [] Uses home oxygen   [] Productive cough   [] Hemoptysis   [] Wheeze  [] COPD   [] Asthma Neurologic:  [] Dizziness  [] Blackouts   [] Seizures   [] History of stroke   [] History of TIA  [] Aphasia   [] Temporary blindness   [] Dysphagia   [] Weakness or numbness in arms   [] Weakness or numbness in legs Musculoskeletal:  [x] Arthritis   [] Joint swelling   [x] Joint pain   [] Low back pain Hematologic:  [] Easy bruising  [] Easy bleeding   [] Hypercoagulable state   [] Anemic   Gastrointestinal:  [] Blood in stool    [] Vomiting blood  [] Gastroesophageal reflux/heartburn   [] Abdominal pain Genitourinary:  [] Chronic kidney disease   [] Difficult urination  [] Frequent urination  [] Burning with urination   [] Hematuria Skin:  [] Rashes   [] Ulcers   [] Wounds Psychological:  [] History of anxiety   []  History of major depression.  Physical Examination  BP 129/81 (BP Location: Right Arm)   Pulse 71   Resp 18   Ht 6\' 3"  (1.905 m)   Wt 180 lb (81.6 kg)   BMI 22.50 kg/m  Gen:  WD/WN, NAD Head: [place]/AT, No temporalis wasting. Ear/Nose/Throat: Hearing grossly intact, nares w/o erythema or drainage Eyes: Conjunctiva clear. Sclera non-icteric Neck: Supple.  Trachea midline Pulmonary:  Good air movement, no use of accessory muscles.  Cardiac: RRR, no JVD Vascular:  Vessel Right Left  Radial Palpable Palpable                          PT 1+ Palpable 1+ Palpable  DP 2+ Palpable 1+ Palpable   Gastrointestinal: soft, non-tender/non-distended. No guarding/reflex.  Musculoskeletal: M/S 5/5 throughout.  No deformity or atrophy.  No edema. Neurologic: Sensation grossly intact in extremities.  Symmetrical.  Speech is fluent.  Psychiatric: Judgment intact, Mood & affect appropriate for pt's clinical situation. Dermatologic: No rashes or ulcers noted.  No cellulitis or open wounds.      Labs Recent Results (from the past 2160 hour(s))  VAS [place] ABI WITH/WO TBI     Status: None   Collection Time: 05/30/22  1:34 PM  Result Value Ref Range   Right ABI 1.03    Left ABI .72   BUN     Status: None   Collection Time: 06/16/22  1:09 PM  Result Value Ref Range   BUN 23 8 - 23 mg/dL    Comment: Performed at [facility], [address] [place], [place] [address]  Creatinine, serum     Status: None   Collection Time: 06/16/22  1:09 PM  Result Value Ref Range   Creatinine, Ser 1.00 0.61 - 1.24 mg/dL   GFR, Estimated >60 >45 mL/min    Comment: (NOTE) Calculated using the CKD-EPI Creatinine Equation  (2021) Performed at [facility], [address] [place], [place] [address]   PSA     Status: None   Collection Time: 06/27/22 11:24 AM  Result Value Ref Range   Prostate Specific Ag, Serum 0.2 0.0 - 4.0 ng/mL    Comment: Roche ECLIA methodology. According to the American Urological Association, Serum  PSA should decrease and remain at undetectable levels after radical prostatectomy. The AUA defines biochemical recurrence as an initial PSA value 0.2 ng/mL or greater followed by a subsequent confirmatory PSA value 0.2 ng/mL or greater. Values obtained with different assay methods or kits cannot be used interchangeably. Results cannot be interpreted as absolute evidence of the presence or absence of malignant disease.     Radiology PERIPHERAL VASCULAR CATHETERIZATION  Result Date: 06/16/2022 See surgical note for result.   Assessment/Plan  Hypertension blood pressure control important in reducing the progression of atherosclerotic disease. On appropriate oral medications.   Atherosclerosis of native arteries of extremity with intermittent claudication [facility]) Had a long discussion today with the patient regarding options.  His veins are marginal but the close we can do a fem above-knee pop my task, prosthetic would be equally durable to vein bypass.  We could also consider a pedal access approach.  The patient is stable with his claudication and actually has an upcoming move to [place].  With that in mind, I think it would be reasonable to continue current medical therapy which she would prefer as well.  He knows he needs to quit smoking and walk more.  Since he is moving to [place], he needs to reestablish care with a vascular surgeon there for further discussions.    Festus Barren, MD  07/04/2022 10:37 AM    This note was created with Dragon medical transcription system.  Any errors from dictation are purely unintentional

## 2022-07-04 NOTE — Assessment & Plan Note (Signed)
blood pressure control important in reducing the progression of atherosclerotic disease. On appropriate oral medications.  

## 2022-07-04 NOTE — Assessment & Plan Note (Signed)
Had a long discussion today with the patient regarding options.  His veins are marginal but the close we can do a fem above-knee pop my task, prosthetic would be equally durable to vein bypass.  We could also consider a pedal access approach.  The patient is stable with his claudication and actually has an upcoming move to [place].  With that in mind, I think it would be reasonable to continue current medical therapy which she would prefer as well.  He knows he needs to quit smoking and walk more.  Since he is moving to [place], he needs to reestablish care with a vascular surgeon there for further discussions.

## 2022-07-25 ENCOUNTER — Other Ambulatory Visit: Payer: Self-pay | Admitting: *Deleted

## 2022-07-25 DIAGNOSIS — C61 Malignant neoplasm of prostate: Secondary | ICD-10-CM

## 2022-07-31 ENCOUNTER — Inpatient Hospital Stay: Payer: Medicare Other | Attending: Radiation Oncology

## 2022-07-31 DIAGNOSIS — C61 Malignant neoplasm of prostate: Secondary | ICD-10-CM | POA: Diagnosis present

## 2022-07-31 LAB — CBC
HCT: 39 % (ref 39.0–52.0)
Hemoglobin: 13.5 g/dL (ref 13.0–17.0)
MCH: 32.1 pg (ref 26.0–34.0)
MCHC: 34.6 g/dL (ref 30.0–36.0)
MCV: 92.9 fL (ref 80.0–100.0)
Platelets: 230 10*3/uL (ref 150–400)
RBC: 4.2 MIL/uL — ABNORMAL LOW (ref 4.22–5.81)
RDW: 11.9 % (ref 11.5–15.5)
WBC: 8.2 10*3/uL (ref 4.0–10.5)
nRBC: 0 % (ref 0.0–0.2)

## 2022-07-31 LAB — PSA: Prostatic Specific Antigen: 0.17 ng/mL (ref 0.00–4.00)

## 2022-08-07 ENCOUNTER — Other Ambulatory Visit: Payer: Self-pay | Admitting: *Deleted

## 2022-08-07 ENCOUNTER — Encounter: Payer: Self-pay | Admitting: Radiation Oncology

## 2022-08-07 ENCOUNTER — Ambulatory Visit
Admission: RE | Admit: 2022-08-07 | Discharge: 2022-08-07 | Disposition: A | Discharge status: Home / Self Care | Payer: Medicare Other | Source: Ambulatory Visit | Attending: Radiation Oncology | Admitting: Radiation Oncology

## 2022-08-07 VITALS — BP 188/73 | HR 73 | Temp 97.5°F | Resp 16 | Wt 176.0 lb

## 2022-08-07 DIAGNOSIS — C61 Malignant neoplasm of prostate: Secondary | ICD-10-CM | POA: Insufficient documentation

## 2022-08-07 DIAGNOSIS — Z923 Personal history of irradiation: Secondary | ICD-10-CM | POA: Insufficient documentation

## 2022-08-07 NOTE — Progress Notes (Signed)
Radiation Oncology Follow up Note  Name: Jimmy Wilson   Date:   08/07/2022 MRN:  [id number] DOB: 1950-05-15    This 72 y.o. male presents to the clinic today for 44-month follow-up status post IMRT radiation therapy to his prostate and pelvic nodes for stage IIIa Gleason 7 (3+4) adenocarcinoma presenting with a PSA over 20.  REFERRING PROVIDER: Loura Back, NP  HPI: Patient is a 72 year old male now out 4 months having completed IMRT radiation therapy to his prostate for stage IIIa Gleason 7 adenocarcinoma the prostate.  Seen today in routine follow-up he is doing well.  Specifically denies any significant increase in lower urinary tract symptoms diarrhea or fatigue.Marland Kitchen  His most recent PSA this month is 0.17.  He is currently on ADT therapy under urology's direction  COMPLICATIONS OF TREATMENT: none  FOLLOW UP COMPLIANCE: keeps appointments   PHYSICAL EXAM:  BP (!) 188/73   Pulse 73   Temp (!) 97.5 F (36.4 C) (Tympanic)   Resp 16   Wt 176 lb (79.8 kg)   BMI 22.00 kg/m  Well-developed well-nourished patient in NAD. HEENT reveals PERLA, EOMI, discs not visualized.  Oral cavity is clear. No oral mucosal lesions are identified. Neck is clear without evidence of cervical or supraclavicular adenopathy. Lungs are clear to A&P. Cardiac examination is essentially unremarkable with regular rate and rhythm without murmur rub or thrill. Abdomen is benign with no organomegaly or masses noted. Motor sensory and DTR levels are equal and symmetric in the upper and lower extremities. Cranial nerves II through XII are grossly intact. Proprioception is intact. No peripheral adenopathy or edema is identified. No motor or sensory levels are noted. Crude visual fields are within normal range.  RADIOLOGY RESULTS: No current films for review  PLAN: Patient is currently under excellent biochemical control of his prostate cancer.  Very low side effect profile.  He continues on ADT therapy.  I am pleased with his  overall progress.  I have asked to see him back in 6 months with a repeat PSA.  Patient knows to call with any concerns.  I would like to take this opportunity to thank you for allowing me to participate in the care of your patient.Carmina Miller, MD

## 2022-09-03 ENCOUNTER — Emergency Department: Payer: Medicare Other

## 2022-09-03 ENCOUNTER — Emergency Department
Admission: EM | Admit: 2022-09-03 | Discharge: 2022-09-03 | Disposition: A | Discharge status: Home / Self Care | Payer: Medicare Other | Attending: Emergency Medicine | Admitting: Emergency Medicine

## 2022-09-03 ENCOUNTER — Other Ambulatory Visit: Payer: Self-pay

## 2022-09-03 DIAGNOSIS — K5730 Diverticulosis of large intestine without perforation or abscess without bleeding: Secondary | ICD-10-CM | POA: Diagnosis not present

## 2022-09-03 DIAGNOSIS — M62838 Other muscle spasm: Secondary | ICD-10-CM | POA: Diagnosis not present

## 2022-09-03 DIAGNOSIS — I10 Essential (primary) hypertension: Secondary | ICD-10-CM | POA: Insufficient documentation

## 2022-09-03 DIAGNOSIS — I700 Atherosclerosis of aorta: Secondary | ICD-10-CM | POA: Insufficient documentation

## 2022-09-03 DIAGNOSIS — M546 Pain in thoracic spine: Secondary | ICD-10-CM | POA: Diagnosis present

## 2022-09-03 DIAGNOSIS — M62830 Muscle spasm of back: Secondary | ICD-10-CM

## 2022-09-03 DIAGNOSIS — J449 Chronic obstructive pulmonary disease, unspecified: Secondary | ICD-10-CM | POA: Diagnosis not present

## 2022-09-03 DIAGNOSIS — N400 Enlarged prostate without lower urinary tract symptoms: Secondary | ICD-10-CM | POA: Diagnosis not present

## 2022-09-03 LAB — URINALYSIS, ROUTINE W REFLEX MICROSCOPIC
Bacteria, UA: NONE SEEN
Bilirubin Urine: NEGATIVE
Glucose, UA: NEGATIVE mg/dL
Ketones, ur: NEGATIVE mg/dL
Leukocytes,Ua: NEGATIVE
Nitrite: NEGATIVE
Protein, ur: NEGATIVE mg/dL
Specific Gravity, Urine: 1.016 (ref 1.005–1.030)
Squamous Epithelial / HPF: NONE SEEN /HPF (ref 0–5)
pH: 5 (ref 5.0–8.0)

## 2022-09-03 MED ORDER — LIDOCAINE 5 % EX PTCH
1.0000 | MEDICATED_PATCH | CUTANEOUS | Status: DC
  Administered 2022-09-03: 1 via TRANSDERMAL
  Filled 2022-09-03: qty 1

## 2022-09-03 MED ORDER — LIDOCAINE 5 % EX PTCH: 1.0000 | MEDICATED_PATCH | Freq: Two times a day (BID) | CUTANEOUS | 0 refills | Status: DC

## 2022-09-03 MED ORDER — NAPROXEN 500 MG PO TABS
500.0000 mg | ORAL_TABLET | Freq: Once | ORAL | Status: AC
  Administered 2022-09-03: 500 mg via ORAL
  Filled 2022-09-03: qty 1

## 2022-09-03 MED ORDER — NAPROXEN 500 MG PO TABS: 500.0000 mg | ORAL_TABLET | Freq: Two times a day (BID) | ORAL | 0 refills | Status: AC

## 2022-09-03 NOTE — ED Notes (Signed)
Patient states pain started nights ago. Took Advil with a little relief.

## 2022-09-03 NOTE — ED Triage Notes (Signed)
Pt states 2 nights ago he went to bed with left sided back pain, states that the pain has cont and reports that it feels like a pulsating pain on his left flank area, pt states that this happens every once in awhile and is uncertain if its kidney stones or muscle spasms, pt is unable to state if he has ever been diagnosed with kidney stones, denies any known injury

## 2022-09-03 NOTE — ED Provider Notes (Signed)
[facility] Provider Note    Event Date/Time   First MD Initiated Contact with Patient 09/03/22 1002     (approximate)   History   Flank Pain   HPI  Jimmy Wilson is a 72 y.o. male with a history of hypertension, COPD, cirrhosis who comes ED complaining of left mid back pain that started 2 nights ago, gradual onset, worsening and constant.  Worse with movement and twisting motion.  No chest pain shortness of breath dizziness or syncope.  No dysuria frequency urgency or hematuria.  Reports has had pain like this before.  He frequently lifts heavy objects.     Physical Exam   Triage Vital Signs: ED Triage Vitals  Enc Vitals Group     BP 09/03/22 0744 (!) 158/77     Pulse Rate 09/03/22 0744 89     Resp 09/03/22 0744 16     Temp 09/03/22 0744 97.8 F (36.6 C)     Temp Source 09/03/22 0744 Oral     SpO2 09/03/22 0744 98 %     Weight 09/03/22 0745 175 lb (79.4 kg)     Height 09/03/22 0745 5\' 3"  (1.6 m)     Head Circumference --      Peak Flow --      Pain Score 09/03/22 0744 8     Pain Loc --      Pain Edu? --      Excl. in GC? --     Most recent vital signs: Vitals:   09/03/22 0744  BP: (!) 158/77  Pulse: 89  Resp: 16  Temp: 97.8 F (36.6 C)  SpO2: 98%    General: Awake, no distress.  CV:  Good peripheral perfusion.  Regular rate and rhythm Resp:  Normal effort.  Clear to auscultation bilaterally Abd:  No distention.  Soft nontender Other:  There is tenseness over the inferior trapezius on the left.  This area is tender to the touch reproducing his pain.   ED Results / Procedures / Treatments   Labs (all labs ordered are listed, but only abnormal results are displayed) Labs Reviewed  URINALYSIS, ROUTINE W REFLEX MICROSCOPIC - Abnormal; Notable for the following components:      Result Value   Color, Urine YELLOW (*)    APPearance HAZY (*)    Hgb urine dipstick SMALL (*)    All other components within normal limits      RADIOLOGY CT abdomen pelvis interpreted by me, negative for ureterolithiasis or hydronephrosis.  Radiology report reviewed   PROCEDURES:  Procedures   MEDICATIONS ORDERED IN ED: Medications  naproxen (NAPROSYN) tablet 500 mg (has no administration in time range)  lidocaine (LIDODERM) 5 % 1 patch (has no administration in time range)     IMPRESSION / MDM / ASSESSMENT AND PLAN / ED COURSE  I reviewed the triage vital signs and the nursing notes.                              Differential diagnosis includes, but is not limited to, ureterolithiasis, cystitis, urinary retention, trapezius  Patient presents with mid back pain.  It is reproducible on exam, consistent with a trapezius strain/spasm.. Considering the patient's symptoms, medical history, and physical examination today, I have low suspicion for ACS, PE, TAD, pneumothorax, carditis, mediastinitis, pneumonia, CHF, or sepsis.  Urinalysis and CT abdomen pelvis are essentially normal, stable for discharge.  FINAL CLINICAL IMPRESSION(S) / ED DIAGNOSES   Final diagnoses:  Spasm of left trapezius muscle     Rx / DC Orders   ED Discharge Orders          Ordered    naproxen (NAPROSYN) 500 MG tablet  2 times daily with meals        09/03/22 1018    lidocaine (LIDODERM) 5 %  Every 12 hours        09/03/22 1018             Note:  This document was prepared using Dragon voice recognition software and may include unintentional dictation errors.   Sharman Cheek, MD 09/03/22 1021

## 2022-09-03 NOTE — ED Provider Triage Note (Signed)
Emergency Medicine Provider Triage Evaluation Note  Jimmy Wilson , a 72 y.o. male  was evaluated in triage.  Pt complains of flank pain x 2 days.  Review of Systems  Positive:  Negative:   Physical Exam  BP (!) 158/77 (BP Location: Left Arm)   Pulse 89   Temp 97.8 F (36.6 C) (Oral)   Resp 16   Ht 5\' 3"  (1.6 m)   Wt 79.4 kg   SpO2 98%   BMI 31.00 kg/m  Gen:   Awake, no distress   Resp:  Normal effort  MSK:   Moves extremities without difficulty  Other:    Medical Decision Making  Medically screening exam initiated at 9:06 AM.  Appropriate orders placed.  Jimmy Wilson was informed that the remainder of the evaluation will be completed by another provider, this initial triage assessment does not replace that evaluation, and the importance of remaining in the ED until their evaluation is complete.     Jimmy Ghee, PA-C 09/03/22 [phone]

## 2022-09-15 ENCOUNTER — Other Ambulatory Visit: Payer: Self-pay | Admitting: General Surgery

## 2022-09-15 DIAGNOSIS — B1920 Unspecified viral hepatitis C without hepatic coma: Secondary | ICD-10-CM

## 2022-09-15 DIAGNOSIS — Z8619 Personal history of other infectious and parasitic diseases: Secondary | ICD-10-CM

## 2022-09-17 ENCOUNTER — Ambulatory Visit
Admission: RE | Admit: 2022-09-17 | Discharge: 2022-09-17 | Disposition: A | Discharge status: Home / Self Care | Payer: Medicare Other | Source: Ambulatory Visit | Attending: General Surgery | Admitting: General Surgery

## 2022-09-17 DIAGNOSIS — Z8619 Personal history of other infectious and parasitic diseases: Secondary | ICD-10-CM | POA: Insufficient documentation

## 2022-09-17 DIAGNOSIS — B1920 Unspecified viral hepatitis C without hepatic coma: Secondary | ICD-10-CM | POA: Insufficient documentation

## 2022-09-17 DIAGNOSIS — K7469 Other cirrhosis of liver: Secondary | ICD-10-CM | POA: Diagnosis present

## 2022-11-24 ENCOUNTER — Other Ambulatory Visit: Payer: Self-pay | Admitting: Family Medicine

## 2022-11-24 DIAGNOSIS — K7030 Alcoholic cirrhosis of liver without ascites: Secondary | ICD-10-CM

## 2022-12-01 ENCOUNTER — Encounter: Admission: RE | Payer: Self-pay | Source: Home / Self Care

## 2022-12-01 ENCOUNTER — Ambulatory Visit
Admission: RE | Admit: 2022-12-01 | Discharge: 2022-12-01 | Disposition: A | Discharge status: Home / Self Care | Payer: Medicare Other | Source: Ambulatory Visit | Attending: Family Medicine | Admitting: Family Medicine

## 2022-12-01 ENCOUNTER — Ambulatory Visit: Admission: RE | Admit: 2022-12-01 | Payer: Medicare Other | Source: Home / Self Care | Admitting: Gastroenterology

## 2022-12-01 DIAGNOSIS — K7030 Alcoholic cirrhosis of liver without ascites: Secondary | ICD-10-CM | POA: Insufficient documentation

## 2022-12-01 SURGERY — COLONOSCOPY WITH PROPOFOL
Anesthesia: General

## 2022-12-16 ENCOUNTER — Other Ambulatory Visit: Payer: Self-pay | Admitting: *Deleted

## 2022-12-16 DIAGNOSIS — C61 Malignant neoplasm of prostate: Secondary | ICD-10-CM

## 2022-12-17 ENCOUNTER — Other Ambulatory Visit: Payer: Medicare Other

## 2022-12-17 DIAGNOSIS — C61 Malignant neoplasm of prostate: Secondary | ICD-10-CM

## 2022-12-18 LAB — PSA: Prostate Specific Ag, Serum: 0.1 ng/mL (ref 0.0–4.0)

## 2022-12-23 ENCOUNTER — Ambulatory Visit (INDEPENDENT_AMBULATORY_CARE_PROVIDER_SITE_OTHER): Payer: Medicare Other | Admitting: Urology

## 2022-12-23 VITALS — BP 165/79 | HR 84 | Ht 75.0 in | Wt 181.1 lb

## 2022-12-23 DIAGNOSIS — C61 Malignant neoplasm of prostate: Secondary | ICD-10-CM

## 2022-12-23 MED ORDER — LEUPROLIDE ACETATE (6 MONTH) 45 MG ~~LOC~~ KIT
45.0000 mg | PACK | Freq: Once | SUBCUTANEOUS | Status: AC
Start: 2022-12-23 — End: 2022-12-23
  Administered 2022-12-23: 45 mg via SUBCUTANEOUS

## 2022-12-23 NOTE — Progress Notes (Signed)
I,Amy L Pierron,acting as a scribe for Vanna Scotland, MD.,have documented all relevant documentation on the behalf of Vanna Scotland, MD,as directed by  Vanna Scotland, MD while in the presence of Vanna Scotland, MD.  12/23/2022 11:56 AM   Jimmy Wilson 30-Mar-1950 [id number]  Referring provider: Leanord Asal, Nelva Bush, MD [address] [place],  [place] [address]  Chief Complaint  Patient presents with   Prostate Cancer    HPI: 72 year-old male with a personal history of prostate cancer presents today for six month follow-up.  He was diagnosed with technically high-risk prostate cancer with a PSA 23.9 and a TRUS volume of 86.9. A biopsy was consistent with high-volume Gleason 3+4 bilaterally, up to 100% of the tissue. A subsequent PET scan was negative. He was started on ADT in 11/2021. He completed IMRT in 03/2022.    His PSA is trending back downwards to 0.1 on 12/17/2022.   He reports his symptoms from the radiation have mostly resolved. He has some urgency and hot flashes sometimes. A friend of his told him to ask about his testosterone levels.    PMH: Past Medical History:  Diagnosis Date   Arthritis 10/22/2021   BACK PAIN 10/31/2008   Qualifier: Diagnosis of  By: Patsy Lager MD, Spencer     Chronic hepatitis C without hepatic coma (HCC) 02/21/2019   Cirrhosis (HCC) 02/21/2019   COPD (chronic obstructive pulmonary disease) (HCC)    HYPERTENSION 10/31/2008   Qualifier: Diagnosis of  By: Patsy Lager MD, Spencer     PSA, INCREASED 03/19/2009   Qualifier: Diagnosis of  By: Patsy Lager MD, Spencer      Surgical History: Past Surgical History:  Procedure Laterality Date   LOWER EXTREMITY ANGIOGRAPHY Left 06/16/2022   Procedure: Lower Extremity Angiography;  Surgeon: Annice Needy, MD;  Location: [facility] INVASIVE CV LAB;  Service: Cardiovascular;  Laterality: Left;   NECK SURGERY      Home Medications:  Allergies as of 12/23/2022       Reactions   Aspirin Hypertension         Medication List        Accurate as of December 23, 2022 11:56 AM. If you have any questions, ask your nurse or doctor.          STOP taking these medications    cilostazol 100 MG tablet Commonly known as: PLETAL   lidocaine 5 % Commonly known as: Lidoderm   VITAMIN B 12 PO       TAKE these medications    clopidogrel 75 MG tablet Commonly known as: PLAVIX Take 75 mg by mouth daily.   lisinopril-hydrochlorothiazide 10-12.5 MG tablet Commonly known as: ZESTORETIC Take 1 tablet by mouth daily.   rosuvastatin 5 MG tablet Commonly known as: CRESTOR SMARTSIG:1 Tablet(s) By Mouth Every Evening   tamsulosin 0.4 MG Caps capsule Commonly known as: FLOMAX Take 1 capsule (0.4 mg total) by mouth daily after supper.        Allergies:  Allergies  Allergen Reactions   Aspirin Hypertension    Family History: Family History  Problem Relation Age of Onset   Diabetes Father    Breast cancer Paternal Grandmother    Appendicitis Paternal Grandfather    Prostate cancer Neg Hx    Bladder Cancer Neg Hx    Kidney cancer Neg Hx     Social History:  reports that he has been smoking cigarettes. He has never used smokeless tobacco. He reports current alcohol use of about 2.0 - 3.0  standard drinks of alcohol per week. He reports that he does not use drugs.   Physical Exam: BP (!) 165/79   Pulse 84   Ht 6\' 3"  (1.905 m)   Wt 181 lb 2 oz (82.2 kg)   BMI 22.64 kg/m   Constitutional:  Alert and oriented, No acute distress. HEENT: [place] AT, moist mucus membranes.  Trachea midline, no masses. Neurologic: Grossly intact, no focal deficits, moving all 4 extremities. Psychiatric: Normal mood and affect.   Assessment & Plan:    1. Prostate cancer  - High risk based on PSA. Since the PSA continues to go down, not concerned with testosterone level at this time.   - Reasonable to complete a minimum 18 months of ADT.   High risk based on PSA.  - Encouraged calcium and vitamin D  supplements along with weight bearing exercises for muscle and bone strength.   - Eligard given today (6 mo depo)  Return in about 6 months (around 06/23/2023) for Eligard.  I have reviewed the above documentation for accuracy and completeness, and I agree with the above.   Vanna Scotland, MD   [facility] [address] [place], [place] [address] [phone]

## 2022-12-23 NOTE — Progress Notes (Signed)
Eligard SubQ Injection   Due to Prostate Cancer patient is present today for a Eligard Injection.  Medication: Eligard 6 month Dose: 45 mg  Location: right UOQ Lot: [id number] Exp: 01/2024  Patient tolerated well, no complications were noted  Performed by: Cheree Fowles H RMA  Per Dr. Apolinar Junes patient is to continue therapy until 2025 at least . Patient's next follow up was scheduled for 06/17/23. This appointment was scheduled using wheel and given to patient today along with reminder continue on Vitamin D 800-1000iu and Calcium 1000-1200mg  daily while on Androgen Deprivation Therapy.  PA approval dates: 12/30/21-12/31/22.

## 2023-01-15 ENCOUNTER — Inpatient Hospital Stay: Payer: Medicare Other | Attending: Radiation Oncology

## 2023-01-21 ENCOUNTER — Encounter: Payer: Self-pay | Admitting: Radiation Oncology

## 2023-01-21 ENCOUNTER — Ambulatory Visit
Admission: RE | Admit: 2023-01-21 | Discharge: 2023-01-21 | Disposition: A | Discharge status: Home / Self Care | Payer: Medicare Other | Source: Ambulatory Visit | Attending: Radiation Oncology | Admitting: Radiation Oncology

## 2023-01-21 ENCOUNTER — Other Ambulatory Visit: Payer: Self-pay | Admitting: *Deleted

## 2023-01-21 VITALS — BP 149/72 | HR 86 | Temp 97.4°F | Resp 16 | Wt 184.0 lb

## 2023-01-21 DIAGNOSIS — J40 Bronchitis, not specified as acute or chronic: Secondary | ICD-10-CM | POA: Diagnosis not present

## 2023-01-21 DIAGNOSIS — Z923 Personal history of irradiation: Secondary | ICD-10-CM | POA: Diagnosis not present

## 2023-01-21 DIAGNOSIS — C61 Malignant neoplasm of prostate: Secondary | ICD-10-CM | POA: Diagnosis present

## 2023-01-21 NOTE — Progress Notes (Signed)
Radiation Oncology Follow up Note  Name: Jimmy Wilson   Date:   01/21/2023 MRN:  [id number] DOB: 20-Jan-1951    This 72 y.o. male presents to the clinic today for 22-month follow-up status post IMRT radiation therapy to his prostate and pelvic nodes for stage IIIa Gleason 7 (3+4) adenocarcinoma presenting with a PSA over 20.  REFERRING PROVIDER: Leanord Asal, Consuelo *  HPI: The patient, a 72 year old with stage 2B squamous cell carcinoma of the right lung, recently completed radiation therapy and is currently on maintenance durvalumab. Concurrently, he received Taxol and carboplatin during radiation therapy. Recently, he experienced increasing shortness of breath, which was treated with Augmentin and thought to be bronchitis. The patient reports improvement in symptoms following the antibiotic treatment. However, he continues to experience lack of energy and drainage. Despite these symptoms, the patient reports no significant difficulties with swallowing..  Patient is currently on ADT therapy through urology.  COMPLICATIONS OF TREATMENT: none  FOLLOW UP COMPLIANCE: keeps appointments   PHYSICAL EXAM:  BP (!) 149/72 Comment: patient will contact PCP if it remains elevated  Pulse 86   Temp (!) 97.4 F (36.3 C) (Tympanic)   Resp 16   Wt 184 lb (83.5 kg)   BMI 23.00 kg/m  Well-developed well-nourished patient in NAD. HEENT reveals PERLA, EOMI, discs not visualized.  Oral cavity is clear. No oral mucosal lesions are identified. Neck is clear without evidence of cervical or supraclavicular adenopathy. Lungs are clear to A&P. Cardiac examination is essentially unremarkable with regular rate and rhythm without murmur rub or thrill. Abdomen is benign with no organomegaly or masses noted. Motor sensory and DTR levels are equal and symmetric in the upper and lower extremities. Cranial nerves II through XII are grossly intact. Proprioception is intact. No peripheral adenopathy or edema is identified.  No motor or sensory levels are noted. Crude visual fields are within normal range.  RADIOLOGY RESULTS: No current films for review  PLAN: Stage IIB Squamous Cell Carcinoma of the Right Lung Completed radiation therapy and concurrent Taxol and carboplatin. Currently on maintenance durvalumab with good tolerance. No current issues with swallowing. -Continue with durvalumab as directed by Dr. Orlie Dakin. -Plan for imaging in late November or early December.  Bronchitis Recent treatment with Augmentin led to improvement in symptoms. -Continue current treatment as directed by pulmonology.  General Health Maintenance -Consider flu and RSVP vaccines. Confirm with Dr. Milinda Cave team due to ongoing immunotherapy.  Follow-up in 3-4 months after imaging studies.    Carmina Miller, MD

## 2023-01-22 ENCOUNTER — Other Ambulatory Visit: Payer: Self-pay | Admitting: Radiation Oncology

## 2023-06-09 ENCOUNTER — Other Ambulatory Visit: Payer: Self-pay

## 2023-06-09 ENCOUNTER — Telehealth: Payer: Self-pay

## 2023-06-09 DIAGNOSIS — C61 Malignant neoplasm of prostate: Secondary | ICD-10-CM

## 2023-06-09 NOTE — Telephone Encounter (Signed)
 PA for Eligard approved via [facility] portal.   Berkley Harvey #[id number]  06/09/2023 - 06/08/2024

## 2023-06-10 LAB — PSA: Prostate Specific Ag, Serum: <0.1 ng/mL (ref 0.0–4.0)

## 2023-06-17 ENCOUNTER — Ambulatory Visit: Payer: Self-pay | Admitting: Urology

## 2023-06-17 VITALS — BP 160/81 | HR 84

## 2023-06-17 DIAGNOSIS — R972 Elevated prostate specific antigen [PSA]: Secondary | ICD-10-CM

## 2023-06-17 DIAGNOSIS — C61 Malignant neoplasm of prostate: Secondary | ICD-10-CM

## 2023-06-17 MED ORDER — TAMSULOSIN HCL 0.4 MG PO CAPS
0.4000 mg | ORAL_CAPSULE | Freq: Every day | ORAL | 3 refills | Status: AC
Start: 2023-06-17 — End: ?

## 2023-06-17 NOTE — Progress Notes (Signed)
 Elfrieda Grise Plume,acting as a scribe for Jimmy Gimenez, MD.,have documented all relevant documentation on the behalf of Jimmy Gimenez, MD,as directed by  Jimmy Gimenez, MD while in the presence of Jimmy Gimenez, MD.  06/17/23 12:30 PM   DARAN FAVARO 27-Feb-1951 [id number]  Referring provider: Tawnya Fava, Waddell Guess, MD [address] [place],  [place] [address]  Chief Complaint  Patient presents with   Follow-up    HPI: 73 year-old male with a history of prostate cancer, presents for a 6 month follow-up visit.   He was diagnosed with technically high-risk prostate cancer with a PSA 23.9 and a TRUS volume of 86.9. A biopsy was consistent with high-volume Gleason 3+4 bilaterally, up to 100% of the tissue. A subsequent PET scan was negative. He was started on ADT in 11/2021. He completed IMRT in 03/2022.   He has been on Eligard for hormone therapy since September 2023 due to being in the high-risk category. The plan is to continue Eligard for 18 months. He reports experiencing hot flashes, which he attributes to the Eligard, and describes them as occurring in clusters. He expresses discomfort with these symptoms, especially as the weather gets warmer.   His PSA on 06/09/2023 was undetectable, which is a positive outcome, but there was a period when the PSA did not initially reach undetectable levels.   He is considering stopping the hormone therapy and would like to discuss this further.    He also reports issues with urination when not taking Flomax, which he resumed after experiencing difficulty urinating.  PMH: Past Medical History:  Diagnosis Date   Arthritis 10/22/2021   BACK PAIN 10/31/2008   Qualifier: Diagnosis of  By: Geralyn Knee MD, Spencer     Chronic hepatitis C without hepatic coma (HCC) 02/21/2019   Cirrhosis (HCC) 02/21/2019   COPD (chronic obstructive pulmonary disease) (HCC)    HYPERTENSION 10/31/2008   Qualifier: Diagnosis of  By: Geralyn Knee MD, Spencer     PSA,  INCREASED 03/19/2009   Qualifier: Diagnosis of  By: Geralyn Knee MD, Spencer      Surgical History: Past Surgical History:  Procedure Laterality Date   LOWER EXTREMITY ANGIOGRAPHY Left 06/16/2022   Procedure: Lower Extremity Angiography;  Surgeon: Celso College, MD;  Location: [facility] INVASIVE CV LAB;  Service: Cardiovascular;  Laterality: Left;   NECK SURGERY      Home Medications:  Allergies as of 06/17/2023       Reactions   Aspirin Hypertension        Medication List        Accurate as of June 17, 2023 12:30 PM. If you have any questions, ask your nurse or doctor.          clopidogrel 75 MG tablet Commonly known as: PLAVIX Take 75 mg by mouth daily.   gabapentin 300 MG capsule Commonly known as: NEURONTIN 1 capsule Orally Once a day for 90 days   lisinopril-hydrochlorothiazide 10-12.5 MG tablet Commonly known as: ZESTORETIC Take 1 tablet by mouth daily.   rosuvastatin 5 MG tablet Commonly known as: CRESTOR SMARTSIG:1 Tablet(s) By Mouth Every Evening   tamsulosin 0.4 MG Caps capsule Commonly known as: FLOMAX Take 1 capsule (0.4 mg total) by mouth daily. What changed: See the new instructions.        Allergies:  Allergies  Allergen Reactions   Aspirin Hypertension    Family History: Family History  Problem Relation Age of Onset   Diabetes Father    Breast cancer Paternal Grandmother  Appendicitis Paternal Grandfather    Prostate cancer Neg Hx    Bladder Cancer Neg Hx    Kidney cancer Neg Hx     Social History:  reports that he has been smoking cigarettes. He has never used smokeless tobacco. He reports current alcohol use of about 2.0 - 3.0 standard drinks of alcohol per week. He reports that he does not use drugs.   Physical Exam: BP (!) 160/81   Pulse 84   Constitutional:  Alert and oriented, No acute distress. HEENT: [place] AT, moist mucus membranes.  Trachea midline, no masses. Neurologic: Grossly intact, no focal deficits, moving all 4  extremities. Psychiatric: Normal mood and affect.   Assessment & Plan:    1. Prostate Cancer with Hormone Therapy - He has been on Eligard for 18 months as part of hormone therapy for prostate cancer.  - The PSA is currently undetectable, which is a positive response. However, he experiences significant hot flashes, a common side effect of Eligard.  - Given the lack of direct comparative studies between 18 months and three years of therapy, and considering his current undetectable PSA, the decision was made to stop Eligard and monitor PSA levels.  - The plan is to check PSA levels in six months to assess stability. If PSA levels rise significantly, further imaging and potential resumption of hormone therapy may be necessary.  2. BPH with LUTS - He is currently taking Flomax for BPH and reports difficulty urinating when not on the medication.  - He has a history of a large prostate and has undergone radiation therapy, which contributes to urinary symptoms.  - The plan is to continue Flomax as it effectively manages symptoms. Refill was provided. - He should monitor for any changes in urinary function and report any issues.  Return in about 6 months (around 12/17/2023) for lab visit with repeat PSA, office visit in 1 year with PSA, IPSS, and PVR.  I have reviewed the above documentation for accuracy and completeness, and I agree with the above.   Jimmy Gimenez, MD    [facility] [address] [place], [place] [address] [phone]

## 2023-07-15 ENCOUNTER — Inpatient Hospital Stay: Payer: Medicare Other | Attending: Radiation Oncology

## 2023-07-22 ENCOUNTER — Ambulatory Visit: Payer: Medicare Other | Attending: Radiation Oncology | Admitting: Radiation Oncology

## 2023-08-17 ENCOUNTER — Other Ambulatory Visit: Payer: Self-pay | Admitting: Family Medicine

## 2023-08-17 DIAGNOSIS — Z87891 Personal history of nicotine dependence: Secondary | ICD-10-CM

## 2023-08-17 DIAGNOSIS — Z136 Encounter for screening for cardiovascular disorders: Secondary | ICD-10-CM

## 2023-08-17 DIAGNOSIS — Z0000 Encounter for general adult medical examination without abnormal findings: Secondary | ICD-10-CM

## 2023-08-28 ENCOUNTER — Encounter: Payer: Self-pay | Admitting: Family Medicine

## 2023-08-31 ENCOUNTER — Ambulatory Visit
Admission: RE | Admit: 2023-08-31 | Discharge: 2023-08-31 | Disposition: A | Discharge status: Home / Self Care | Source: Ambulatory Visit | Attending: Family Medicine | Admitting: Family Medicine

## 2023-08-31 DIAGNOSIS — J439 Emphysema, unspecified: Secondary | ICD-10-CM | POA: Diagnosis not present

## 2023-08-31 DIAGNOSIS — Z87891 Personal history of nicotine dependence: Secondary | ICD-10-CM | POA: Diagnosis present

## 2023-08-31 DIAGNOSIS — Z136 Encounter for screening for cardiovascular disorders: Secondary | ICD-10-CM | POA: Insufficient documentation

## 2023-08-31 DIAGNOSIS — Z0000 Encounter for general adult medical examination without abnormal findings: Secondary | ICD-10-CM

## 2023-08-31 DIAGNOSIS — I700 Atherosclerosis of aorta: Secondary | ICD-10-CM | POA: Diagnosis not present

## 2023-08-31 DIAGNOSIS — K7460 Unspecified cirrhosis of liver: Secondary | ICD-10-CM | POA: Diagnosis not present

## 2023-08-31 DIAGNOSIS — Z122 Encounter for screening for malignant neoplasm of respiratory organs: Secondary | ICD-10-CM | POA: Diagnosis not present

## 2023-08-31 DIAGNOSIS — E042 Nontoxic multinodular goiter: Secondary | ICD-10-CM | POA: Diagnosis not present

## 2023-10-17 ENCOUNTER — Other Ambulatory Visit: Payer: Self-pay

## 2023-10-17 ENCOUNTER — Emergency Department
Admission: EM | Admit: 2023-10-17 | Discharge: 2023-10-17 | Disposition: A | Discharge status: Home / Self Care | Attending: Emergency Medicine | Admitting: Emergency Medicine

## 2023-10-17 DIAGNOSIS — Z79899 Other long term (current) drug therapy: Secondary | ICD-10-CM | POA: Diagnosis not present

## 2023-10-17 DIAGNOSIS — M7632 Iliotibial band syndrome, left leg: Secondary | ICD-10-CM | POA: Insufficient documentation

## 2023-10-17 DIAGNOSIS — M898X8 Other specified disorders of bone, other site: Secondary | ICD-10-CM

## 2023-10-17 DIAGNOSIS — M7622 Iliac crest spur, left hip: Secondary | ICD-10-CM | POA: Insufficient documentation

## 2023-10-17 DIAGNOSIS — I10 Essential (primary) hypertension: Secondary | ICD-10-CM | POA: Insufficient documentation

## 2023-10-17 DIAGNOSIS — M7989 Other specified soft tissue disorders: Secondary | ICD-10-CM

## 2023-10-17 MED ORDER — TRAMADOL HCL 50 MG PO TABS: 50.0000 mg | ORAL_TABLET | Freq: Four times a day (QID) | ORAL | 0 refills | Status: AC | PRN

## 2023-10-17 NOTE — ED Notes (Signed)
 Patient stated that the lower back pain began on Tuesday. Patient has been seen by his PCP and was given medications to aid with his back but he states it has not helped. Patient denies injury.

## 2023-10-17 NOTE — ED Provider Notes (Signed)
 [place] [facility] AT [facility] Provider Note   CSN: [id number] Arrival date & time: 10/17/23  9370     Patient presents with: Back Pain   Jimmy Wilson is a 73 y.o. male with PMH of HTN, lumbar degenerative disc disease, spondylosis as well as some curvature of the thoracolumbar spine presents to the emergency department for evaluation of left iliac crest pain.  Patient with focal pain to the left iliac crest this been present over the last 4 days.  2 days ago patient was prescribed a 6-day steroid taper.  He took the first dose of 60 mg yesterday but has not started his 50 mg dosage today.  He continues to have some pain with standing along the left iliac crest.  He has no other pain throughout his back, abdomen, groin, thigh and denies any numbness or tingling.  No urinary symptoms, fevers, nausea, vomiting, scrotal pain.  Patient denies any falls trauma or injury.  Has had similar pain in the past with negative CT scan showing no stones.     HPI     Prior to Admission medications   Medication Sig Start Date End Date Taking? Authorizing Provider  traMADol  (ULTRAM ) 50 MG tablet Take 1 tablet (50 mg total) by mouth every 6 (six) hours as needed. 10/17/23 10/16/24 Yes Charlene Debby BROCKS, PA-C  clopidogrel (PLAVIX) 75 MG tablet Take 75 mg by mouth daily. 05/12/22   [provider]  gabapentin (NEURONTIN) 300 MG capsule 1 capsule Orally Once a day for 90 days 05/14/23   [provider]  lisinopril -hydrochlorothiazide (ZESTORETIC ) 10-12.5 MG tablet Take 1 tablet by mouth daily. 02/01/19   [provider]  rosuvastatin (CRESTOR) 5 MG tablet SMARTSIG:1 Tablet(s) By Mouth Every Evening 10/07/21   [provider]  tamsulosin  (FLOMAX ) 0.4 MG CAPS capsule Take 1 capsule (0.4 mg total) by mouth daily. 06/17/23   Penne Knee, MD    Allergies: Aspirin    Review of Systems  Updated Vital Signs BP 127/80   Pulse 79   Temp 97.8 F (36.6 C) (Oral)    Resp 16   Ht 6' 3 (1.905 m)   Wt 79.4 kg   SpO2 98%   BMI 21.87 kg/m   Physical Exam Constitutional:      Appearance: He is well-developed.  HENT:     Head: Normocephalic and atraumatic.  Eyes:     Conjunctiva/sclera: Conjunctivae normal.  Cardiovascular:     Rate and Rhythm: Normal rate.  Pulmonary:     Effort: Pulmonary effort is normal. No respiratory distress.  Abdominal:     General: There is no distension.     Palpations: There is no mass.     Tenderness: There is no abdominal tenderness. There is no right CVA tenderness, left CVA tenderness, guarding or rebound.     Hernia: No hernia is present.  Musculoskeletal:        General: Normal range of motion.     Cervical back: Normal range of motion.     Comments: No spinous process tenderness along the thoracolumbar spine with percussion.  No paravertebral muscle tenderness.  He has focal pain along the superior aspect of the left iliac crest with palpable rib impingement with leaning to the left.  Palpation of the left superior iliac crest reproduces the patient's pain completely.  When he leans to the left he also reproduces his pain and palpable impingement of the rib to the iliac crest is present.  There is  no swelling warmth or redness in the area.  His hip moves well with internal and external rotation.  He has no neurological deficits in the lower extremities has good ankle plantarflexion, dorsiflexion, EHL strength.  Skin:    General: Skin is warm.     Findings: No rash.  Neurological:     General: No focal deficit present.     Mental Status: He is alert and oriented to person, place, and time. Mental status is at baseline.  Psychiatric:        Behavior: Behavior normal.        Thought Content: Thought content normal.     (all labs ordered are listed, but only abnormal results are displayed) Labs Reviewed - No data to display  EKG: None  Radiology: No results found.   Procedures   Medications Ordered in  the ED - No data to display                                  Medical Decision Making Risk Prescription drug management.   73 year old male with nontraumatic focal pain to the left iliac crest.  History of lumbar and thoracic spine to space degeneration, spondylosis with some thoracolumbar curvature of the spine.  He has focal and palpable pain along the superior iliac crest with palpable impingement of the rib along the iliac crest.  No urinary symptoms, flank pain, fevers, abdominal pain.  No neurological deficits on exam.  No concern for compression fracture due to no pain or discomfort with percussion along the spinous process of the thoracolumbar spine.  Offered x-rays labs and imaging to the patient but we both felt as if this was not necessary due to his physical exam findings today.  We will have him continue with his 6-day steroid taper starting with 50 mg today.  He will continue with tizanidine and he is given a prescription for tramadol  to take as needed for breakthrough pain.  Discussed follow-up with PCP or orthopedics if symptoms persist.  He understands signs symptoms return to the ER for.  Final diagnoses:  Iliac crest bone pain  Iliocostal friction syndrome    ED Discharge Orders          Ordered    traMADol  (ULTRAM ) 50 MG tablet  Every 6 hours PRN        10/17/23 0758               Charlene Debby BROCKS, PA-C 10/17/23 9191    Dorothyann Drivers, MD 10/17/23 1353

## 2023-10-17 NOTE — ED Triage Notes (Signed)
 Pt to ED via POV c/o back pain. Says he's had 3-4 episodes of this back pain in 10 years. This episode started Wednesday. Pt went to PCP and was prescribed prednisone . Denies urinary sx

## 2023-10-17 NOTE — Discharge Instructions (Signed)
 Iliocostal friction syndrome, also known as iliocostal impingement syndrome, is a painful condition caused by the lower ribs rubbing against the iliac crest (the upper part of the hip bone). This friction can lead to inflammation, pain, and even nerve irritation.  Please continue with your prednisone  taper.  You may use tramadol  for additional pain relief.  He may also continue with tizanidine as needed.  Call primary care provider or orthopedics in 1 week if continued pain.  Return to the ER if you develop any severe increasing pain abdominal pain fevers burning with urination or any urgent changes in your health

## 2023-11-23 ENCOUNTER — Other Ambulatory Visit: Payer: Self-pay

## 2023-11-23 DIAGNOSIS — R972 Elevated prostate specific antigen [PSA]: Secondary | ICD-10-CM

## 2023-12-16 ENCOUNTER — Other Ambulatory Visit

## 2023-12-16 DIAGNOSIS — R972 Elevated prostate specific antigen [PSA]: Secondary | ICD-10-CM

## 2023-12-17 LAB — PSA: Prostate Specific Ag, Serum: 0.2 ng/mL (ref 0.0–4.0)

## 2024-02-24 ENCOUNTER — Other Ambulatory Visit: Payer: Self-pay | Admitting: Family Medicine

## 2024-02-24 ENCOUNTER — Telehealth: Payer: Self-pay

## 2024-02-24 DIAGNOSIS — B182 Chronic viral hepatitis C: Secondary | ICD-10-CM

## 2024-02-24 DIAGNOSIS — E042 Nontoxic multinodular goiter: Secondary | ICD-10-CM

## 2024-02-24 DIAGNOSIS — I25111 Atherosclerotic heart disease of native coronary artery with angina pectoris with documented spasm: Secondary | ICD-10-CM

## 2024-02-24 NOTE — Telephone Encounter (Signed)
 I received an order for an echo and gave to diana

## 2024-03-02 ENCOUNTER — Ambulatory Visit
Admission: RE | Admit: 2024-03-02 | Discharge: 2024-03-02 | Disposition: A | Source: Ambulatory Visit | Attending: Family Medicine | Admitting: Family Medicine

## 2024-03-02 ENCOUNTER — Ambulatory Visit
Admission: RE | Admit: 2024-03-02 | Discharge: 2024-03-02 | Disposition: A | Discharge status: Home / Self Care | Source: Ambulatory Visit | Attending: Family Medicine | Admitting: Family Medicine

## 2024-03-02 DIAGNOSIS — E042 Nontoxic multinodular goiter: Secondary | ICD-10-CM | POA: Diagnosis present

## 2024-03-02 DIAGNOSIS — B182 Chronic viral hepatitis C: Secondary | ICD-10-CM | POA: Diagnosis present

## 2024-03-23 ENCOUNTER — Ambulatory Visit: Payer: Medicare (Managed Care)

## 2024-04-13 ENCOUNTER — Other Ambulatory Visit: Payer: Self-pay | Admitting: Unknown Physician Specialty

## 2024-04-13 DIAGNOSIS — E041 Nontoxic single thyroid nodule: Secondary | ICD-10-CM

## 2024-04-20 ENCOUNTER — Ambulatory Visit: Payer: Medicare (Managed Care)

## 2024-06-15 ENCOUNTER — Other Ambulatory Visit

## 2024-06-21 ENCOUNTER — Ambulatory Visit: Admitting: Urology

## 2024-06-23 ENCOUNTER — Ambulatory Visit: Admitting: Urology
